# Patient Record
Sex: Male | Born: 1985 | Race: Asian | Hispanic: No | Marital: Married | Smoking: Former smoker
Health system: Southern US, Community
[De-identification: ages and names within clinical notes are randomized; demographics above are authoritative.]

## PROBLEM LIST (undated history)

## (undated) DIAGNOSIS — K648 Other hemorrhoids: Secondary | ICD-10-CM

## (undated) DIAGNOSIS — R519 Headache, unspecified: Secondary | ICD-10-CM

## (undated) DIAGNOSIS — R42 Dizziness and giddiness: Secondary | ICD-10-CM

## (undated) DIAGNOSIS — R51 Headache: Secondary | ICD-10-CM

## (undated) HISTORY — DX: Headache: R51

## (undated) HISTORY — PX: NASAL SINUS SURGERY: SHX719

## (undated) HISTORY — DX: Other hemorrhoids: K64.8

## (undated) HISTORY — DX: Headache, unspecified: R51.9

## (undated) HISTORY — DX: Dizziness and giddiness: R42

---

## 2006-09-26 ENCOUNTER — Ambulatory Visit: Payer: Self-pay | Admitting: Gastroenterology

## 2006-10-31 ENCOUNTER — Ambulatory Visit: Payer: Self-pay | Admitting: Gastroenterology

## 2008-06-17 ENCOUNTER — Emergency Department (HOSPITAL_COMMUNITY): Admission: EM | Admit: 2008-06-17 | Discharge: 2008-06-17 | Payer: Self-pay | Admitting: Family Medicine

## 2008-07-02 ENCOUNTER — Emergency Department (HOSPITAL_COMMUNITY): Admission: EM | Admit: 2008-07-02 | Discharge: 2008-07-02 | Payer: Self-pay | Admitting: Family Medicine

## 2008-11-17 ENCOUNTER — Emergency Department (HOSPITAL_COMMUNITY): Admission: EM | Admit: 2008-11-17 | Discharge: 2008-11-17 | Payer: Self-pay | Admitting: Family Medicine

## 2010-02-09 ENCOUNTER — Emergency Department (HOSPITAL_COMMUNITY): Admission: EM | Admit: 2010-02-09 | Discharge: 2010-02-09 | Payer: Self-pay | Admitting: Family Medicine

## 2010-02-22 ENCOUNTER — Emergency Department (HOSPITAL_COMMUNITY): Admission: EM | Admit: 2010-02-22 | Discharge: 2010-02-22 | Payer: Self-pay | Admitting: Family Medicine

## 2010-04-26 ENCOUNTER — Emergency Department (HOSPITAL_COMMUNITY): Admission: EM | Admit: 2010-04-26 | Discharge: 2010-04-26 | Payer: Self-pay | Admitting: Emergency Medicine

## 2010-10-01 ENCOUNTER — Inpatient Hospital Stay (INDEPENDENT_AMBULATORY_CARE_PROVIDER_SITE_OTHER)
Admission: RE | Admit: 2010-10-01 | Discharge: 2010-10-01 | Disposition: A | Discharge status: Home / Self Care | Payer: Self-pay | Source: Ambulatory Visit | Attending: Emergency Medicine | Admitting: Emergency Medicine

## 2010-10-01 DIAGNOSIS — K140 Glossitis: Secondary | ICD-10-CM

## 2010-10-01 DIAGNOSIS — J309 Allergic rhinitis, unspecified: Secondary | ICD-10-CM

## 2010-10-01 DIAGNOSIS — K219 Gastro-esophageal reflux disease without esophagitis: Secondary | ICD-10-CM

## 2011-05-10 LAB — POCT H PYLORI SCREEN: H. PYLORI SCREEN, POC: NEGATIVE

## 2011-05-23 ENCOUNTER — Inpatient Hospital Stay (INDEPENDENT_AMBULATORY_CARE_PROVIDER_SITE_OTHER)
Admission: RE | Admit: 2011-05-23 | Discharge: 2011-05-23 | Disposition: A | Discharge status: Home / Self Care | Payer: Self-pay | Source: Ambulatory Visit | Attending: Family Medicine | Admitting: Family Medicine

## 2011-05-23 DIAGNOSIS — J309 Allergic rhinitis, unspecified: Secondary | ICD-10-CM

## 2011-08-23 ENCOUNTER — Emergency Department (HOSPITAL_COMMUNITY)
Admission: EM | Admit: 2011-08-23 | Discharge: 2011-08-23 | Disposition: A | Discharge status: Home / Self Care | Payer: Self-pay | Source: Home / Self Care | Attending: Emergency Medicine | Admitting: Emergency Medicine

## 2011-08-23 ENCOUNTER — Encounter (HOSPITAL_COMMUNITY): Payer: Self-pay | Admitting: Cardiology

## 2011-08-23 DIAGNOSIS — J309 Allergic rhinitis, unspecified: Secondary | ICD-10-CM

## 2011-08-23 DIAGNOSIS — J329 Chronic sinusitis, unspecified: Secondary | ICD-10-CM

## 2011-08-23 MED ORDER — CHLORPHENIRAMINE-PSEUDOEPH 12-120 MG PO TB12: 1.0000 | ORAL_TABLET | Freq: Two times a day (BID) | ORAL | Status: DC

## 2011-08-23 MED ORDER — AMOXICILLIN 500 MG PO CAPS: 1000.0000 mg | ORAL_CAPSULE | Freq: Three times a day (TID) | ORAL | Status: AC

## 2011-08-23 MED ORDER — FLUTICASONE PROPIONATE 50 MCG/ACT NA SUSP: 2.0000 | Freq: Every day | NASAL | Status: DC

## 2011-08-23 NOTE — ED Provider Notes (Signed)
Chief Complaint  Patient presents with  . Nasal Congestion  . Headache  . Dizziness  . Fatigue    History of Present Illness:  The patient has been here multiple times before for the same symptoms. There is somewhat of a language barrier. He describes a one to two-year history of chronic nasal congestion on the left, sneezing, rhinorrhea, sinus pressure, headache, and dizziness. Sometimes this interferes with his sleep and he states he does not sleep well, dreams constantly, and wakes up feeling tired. He states she's tried various antibiotics, decongestants, nasal sprays without relief. He has been referred to an ENT doctor but he was never able to followup because of lack of insurance. He comes in today concerned because the drainage has been somewhat bloody. He denies fever, chills, sore throat, or cough.  Review of Systems:  Other than noted above, the patient denies any of the following symptoms. Systemic:  No fever, chills, sweats, fatigue, myalgias, headache, or anorexia. Eye:  No redness, pain or drainage. ENT:  No earache, nasal congestion, rhinorrhea, sinus pressure, or sore throat. Lungs:  No cough, sputum production, wheezing, shortness of breath. Or chest pain. GI:  No nausea, vomiting, abdominal pain or diarrhea. Skin:  No rash or itching.  PMFSH:  Past medical history, family history, social history, meds, and allergies were reviewed.  Physical Exam:   Vital signs:  BP 130/87  Pulse 69  Temp(Src) 98 F (36.7 C) (Oral)  Resp 18  SpO2 98% General:  Alert, in no distress. Eye:  No conjunctival injection or drainage. ENT:  TMs and canals were normal, without erythema or inflammation.  Nasal mucosa was clear and uncongested, without drainage.  Mucous membranes were moist.  Pharynx was clear, without exudate or drainage.  There were no oral ulcerations or lesions. Neck:  Supple, no adenopathy, tenderness or mass. Lungs:  No respiratory distress.  Lungs were clear to  auscultation, without wheezes, rales or rhonchi.  Breath sounds were clear and equal bilaterally. Heart:  Regular rhythm, without gallops, murmers or rubs. Skin:  Clear, warm, and dry, without rash or lesions.  Labs:     Radiology:  No results found.  Medications given in [facility]:  None  Assessment:   Diagnoses that have been ruled out:  None  Diagnoses that are still under consideration:  None  Final diagnoses:  Allergic rhinitis  Sinusitis      Plan:   1.  The following meds were prescribed:   New Prescriptions   AMOXICILLIN (AMOXIL) 500 MG CAPSULE    Take 2 capsules (1,000 mg total) by mouth 3 (three) times daily.   CHLORPHENIRAMINE-PSEUDOEPH 12-120 MG TB12    Take 1 tablet by mouth 2 (two) times daily.   FLUTICASONE (FLONASE) 50 MCG/ACT NASAL SPRAY    Place 2 sprays into the nose daily.   2.  The patient was instructed in symptomatic care and handouts were given. 3.  The patient was told to return if becoming worse in any way, if no better in 3 or 4 days, and given some red flag symptoms that would indicate earlier return. 4.  He really needs to see an ENT doctor. I think he just has allergic rhinitis and chronic sinusitis. Try medications in the meantime, I think is a likelihood that he'll followup with the ENT to be referred him to.   Roque Lias, MD 08/23/11 [phone]

## 2011-08-23 NOTE — ED Notes (Signed)
Pt reports headache and nasal congestion. Pt reports nasal drainage red for the past couple days. No red drainage on exam. Denies fever/chills. Toleration PO intake.  Sleeplessness for the past 2 years.

## 2011-10-28 ENCOUNTER — Emergency Department (HOSPITAL_COMMUNITY)
Admission: EM | Admit: 2011-10-28 | Discharge: 2011-10-28 | Disposition: A | Discharge status: Home / Self Care | Payer: Self-pay | Source: Home / Self Care | Attending: Emergency Medicine | Admitting: Emergency Medicine

## 2011-10-28 ENCOUNTER — Encounter (HOSPITAL_COMMUNITY): Payer: Self-pay | Admitting: *Deleted

## 2011-10-28 DIAGNOSIS — R05 Cough: Secondary | ICD-10-CM

## 2011-10-28 DIAGNOSIS — J309 Allergic rhinitis, unspecified: Secondary | ICD-10-CM

## 2011-10-28 MED ORDER — PREDNISONE 10 MG PO TABS: ORAL_TABLET | ORAL | Status: DC

## 2011-10-28 MED ORDER — AMOXICILLIN 500 MG PO CAPS: 1000.0000 mg | ORAL_CAPSULE | Freq: Three times a day (TID) | ORAL | Status: AC

## 2011-10-28 MED ORDER — METHYLPREDNISOLONE ACETATE PF 80 MG/ML IJ SUSP
80.0000 mg | Freq: Once | INTRAMUSCULAR | Status: AC
  Administered 2011-10-28: 80 mg via INTRAMUSCULAR

## 2011-10-28 MED ORDER — CETIRIZINE-PSEUDOEPHEDRINE ER 5-120 MG PO TB12: 1.0000 | ORAL_TABLET | Freq: Every day | ORAL | Status: AC

## 2011-10-28 MED ORDER — METHYLPREDNISOLONE ACETATE 80 MG/ML IJ SUSP
INTRAMUSCULAR | Status: AC
  Filled 2011-10-28: qty 1

## 2011-10-28 MED ORDER — GUAIFENESIN-CODEINE 100-10 MG/5ML PO SYRP: 10.0000 mL | ORAL_SOLUTION | Freq: Four times a day (QID) | ORAL | Status: AC | PRN

## 2011-10-28 NOTE — Discharge Instructions (Signed)
Allergic Rhinitis  Allergic rhinitis is when the mucous membranes in the nose respond to allergens. Allergens are particles in the air that cause your body to have an allergic reaction. This causes you to release allergic antibodies. Through a chain of events, these eventually cause you to release histamine into the blood stream (hence the use of antihistamines). Although meant to be protective to the body, it is this release that causes your discomfort, such as frequent sneezing, congestion and an itchy runny nose.    CAUSES    The pollen allergens may come from grasses, trees, and weeds. This is seasonal allergic rhinitis, or "hay fever." Other allergens cause year-round allergic rhinitis (perennial allergic rhinitis) such as house dust mite allergen, pet dander and mold spores.    SYMPTOMS     Nasal stuffiness (congestion).    Runny, itchy nose with sneezing and tearing of the eyes.    There is often an itching of the mouth, eyes and ears.   It cannot be cured, but it can be controlled with medications.  DIAGNOSIS    If you are unable to determine the offending allergen, skin or blood testing may find it.  TREATMENT     Avoid the allergen.    Medications and allergy shots (immunotherapy) can help.    Hay fever may often be treated with antihistamines in pill or nasal spray forms. Antihistamines block the effects of histamine. There are over-the-counter medicines that may help with nasal congestion and swelling around the eyes. Check with your caregiver before taking or giving this medicine.   If the treatment above does not work, there are many new medications your caregiver can prescribe. Stronger medications may be used if initial measures are ineffective. Desensitizing injections can be used if medications and avoidance fails. Desensitization is when a patient is given ongoing shots until the body becomes less sensitive to the allergen. Make sure you follow up with your caregiver if problems continue.   SEEK MEDICAL CARE IF:     You develop fever (more than 100.5 F (38.1 C).    You develop a cough that does not stop easily (persistent).    You have shortness of breath.    You start wheezing.    Symptoms interfere with normal daily activities.   Document Released: 04/19/2001 Document Revised: 07/14/2011 Document Reviewed: 10/29/2008  ExitCare Patient Information 2012 [employer].    Cough, Adult   A cough is a reflex that helps clear your throat and airways. It can help heal the body or may be a reaction to an irritated airway. A cough may only last 2 or 3 weeks (acute) or may last more than 8 weeks (chronic).    CAUSES  Acute cough:   Viral or bacterial infections.   Chronic cough:   Infections.    Allergies.    Asthma.    Post-nasal drip.    Smoking.    Heartburn or acid reflux.    Some medicines.    Chronic lung problems (COPD).    Cancer.   SYMPTOMS    Cough.    Fever.    Chest pain.    Increased breathing rate.    High-pitched whistling sound when breathing (wheezing).    Colored mucus that you cough up (sputum).   TREATMENT     A bacterial cough may be treated with antibiotic medicine.    A viral cough must run its course and will not respond to antibiotics.    Your caregiver may   recommend other treatments if you have a chronic cough.   HOME CARE INSTRUCTIONS     Only take over-the-counter or prescription medicines for pain, discomfort, or fever as directed by your caregiver. Use cough suppressants only as directed by your caregiver.    Use a cold steam vaporizer or humidifier in your bedroom or home to help loosen secretions.    Sleep in a semi-upright position if your cough is worse at night.    Rest as needed.    Stop smoking if you smoke.   SEEK IMMEDIATE MEDICAL CARE IF:     You have pus in your sputum.    Your cough starts to worsen.    You cannot control your cough with suppressants and are losing sleep.    You begin coughing up blood.     You have difficulty breathing.    You develop pain which is getting worse or is uncontrolled with medicine.    You have a fever.   MAKE SURE YOU:     Understand these instructions.    Will watch your condition.    Will get help right away if you are not doing well or get worse.   Document Released: 01/21/2011 Document Revised: 07/14/2011 Document Reviewed: 01/21/2011  Exit[employer] Patient Information 2012 [employer].

## 2011-10-28 NOTE — ED Provider Notes (Signed)
Chief Complaint  Patient presents with  . Cough    History of Present Illness:   The patient is a 26 year old male who has been here multiple times in the past for similar symptoms. These usually involve some combination of nasal congestion, rhinorrhea, sneezing, postnasal drip, and cough. He's been tried on multiple meds in the past. Nothing seems to give him permanent relief. It has been suggested that he see an allergist, but he cannot afford to do this. He was prescribed Flonase but doesn't like to use a nasal spray. He would prefer to take pills. His current major complaint is cough going on for about the past 2 weeks. Is productive of some yellow sputum. He also complains of headache, nasal congestion, rhinorrhea, sneezing, postnasal drip, itchy, watery eyes, and scratchy throat. He denies any fever or chills. He hasn't had any wheezing, chest tightness, or shortness of breath. He denies any reflux symptoms.  Review of Systems:  Other than noted above, the patient denies any of the following symptoms. Systemic:  No fever, chills, sweats, fatigue, myalgias, headache, or anorexia. Eye:  No redness, pain or drainage. ENT:  No earache, nasal congestion, rhinorrhea, sinus pressure, or sore throat. Lungs:  No cough, sputum production, wheezing, shortness of breath. Or chest pain. GI:  No nausea, vomiting, abdominal pain or diarrhea. Skin:  No rash or itching.  PMFSH:  Past medical history, family history, social history, meds, and allergies were reviewed.  Physical Exam:   Vital signs:  BP 155/93  Pulse 72  Temp(Src) 97.2 F (36.2 C) (Oral)  Resp 18  SpO2 98% General:  Alert, in no distress. Eye:  No conjunctival injection or drainage. ENT:  TMs and canals were normal, without erythema or inflammation.  Nasal mucosa was clear and uncongested, without drainage.  Mucous membranes were moist.  Pharynx was clear, without exudate or drainage.  There were no oral ulcerations or lesions. Neck:   Supple, no adenopathy, tenderness or mass. Lungs:  No respiratory distress.  Lungs were clear to auscultation, without wheezes, rales or rhonchi.  Breath sounds were clear and equal bilaterally. Heart:  Regular rhythm, without gallops, murmers or rubs. Skin:  Clear, warm, and dry, without rash or lesions.  Assessment:   Diagnoses that have been ruled out:  None  Diagnoses that are still under consideration:  None  Final diagnoses:  Cough - I think his chronic cough symptoms are due to postnasal drainage from allergic rhinitis, sinusitis, or both. I think he needs to see an allergist, but I doubt that is going to follow through with this.   Allergic rhinitis      Plan:   1.  The following meds were prescribed:   New Prescriptions   AMOXICILLIN (AMOXIL) 500 MG CAPSULE    Take 2 capsules (1,000 mg total) by mouth 3 (three) times daily.   CETIRIZINE-PSEUDOEPHEDRINE (ZYRTEC-D) 5-120 MG PER TABLET    Take 1 tablet by mouth daily.   GUAIFENESIN-CODEINE (GUIATUSS AC) 100-10 MG/5ML SYRUP    Take 10 mLs by mouth 4 (four) times daily as needed for cough.   PREDNISONE (DELTASONE) 10 MG TABLET    Take 4 tabs daily for 4 days, 3 tabs daily for 4 days, 2 tabs daily for 4 days, then 1 tab daily for 4 days.   2.  The patient was instructed in symptomatic care and handouts were given. 3.  The patient was told to return if becoming worse in any way, if no better in 3  or 4 days, and given some red flag symptoms that would indicate earlier return.   Reuben Likes, MD 10/28/11 1728

## 2011-10-28 NOTE — ED Notes (Signed)
Pt  Reports  Symptoms  Of  Cough   /  Congested       Head  Sinus  Feels  Stuffy   And  Congested    Symptoms  X  2  Weeks       He  Is  Sitting upright on  Exam  Table    Speaking in  Complete  sentances

## 2011-11-09 ENCOUNTER — Other Ambulatory Visit (HOSPITAL_COMMUNITY): Payer: Self-pay | Admitting: Family Medicine

## 2011-11-11 ENCOUNTER — Ambulatory Visit (HOSPITAL_COMMUNITY)
Admission: RE | Admit: 2011-11-11 | Discharge: 2011-11-11 | Disposition: A | Discharge status: Home / Self Care | Payer: Self-pay | Source: Ambulatory Visit | Attending: Family Medicine | Admitting: Family Medicine

## 2011-11-11 DIAGNOSIS — R51 Headache: Secondary | ICD-10-CM | POA: Insufficient documentation

## 2011-11-11 DIAGNOSIS — J3489 Other specified disorders of nose and nasal sinuses: Secondary | ICD-10-CM | POA: Insufficient documentation

## 2011-12-28 ENCOUNTER — Emergency Department (HOSPITAL_COMMUNITY)
Admission: EM | Admit: 2011-12-28 | Discharge: 2011-12-28 | Disposition: A | Discharge status: Home / Self Care | Payer: Self-pay | Source: Home / Self Care | Attending: Family Medicine | Admitting: Family Medicine

## 2011-12-28 ENCOUNTER — Encounter (HOSPITAL_COMMUNITY): Payer: Self-pay

## 2011-12-28 DIAGNOSIS — J302 Other seasonal allergic rhinitis: Secondary | ICD-10-CM

## 2011-12-28 DIAGNOSIS — J309 Allergic rhinitis, unspecified: Secondary | ICD-10-CM

## 2011-12-28 MED ORDER — FLUTICASONE PROPIONATE 50 MCG/ACT NA SUSP: 1.0000 | Freq: Two times a day (BID) | NASAL | Status: DC

## 2011-12-28 MED ORDER — FEXOFENADINE HCL 180 MG PO TABS: 180.0000 mg | ORAL_TABLET | Freq: Every day | ORAL | Status: DC

## 2011-12-28 MED ORDER — METHYLPREDNISOLONE ACETATE 40 MG/ML IJ SUSP
80.0000 mg | Freq: Once | INTRAMUSCULAR | Status: AC
  Administered 2011-12-28: 80 mg via INTRAMUSCULAR

## 2011-12-28 MED ORDER — METHYLPREDNISOLONE ACETATE 80 MG/ML IJ SUSP
INTRAMUSCULAR | Status: AC
  Filled 2011-12-28: qty 1

## 2011-12-28 MED ORDER — METHYLPREDNISOLONE 4 MG PO KIT: PACK | ORAL | Status: AC

## 2011-12-28 NOTE — ED Provider Notes (Signed)
History     CSN: [id number]  Arrival date & time 12/28/11  1715   First MD Initiated Contact with Patient 12/28/11 1732      Chief Complaint  Patient presents with  . Sinus Problem    (Consider location/radiation/quality/duration/timing/severity/associated sxs/prior treatment) Patient is a 26 y.o. male presenting with sinus complaint. The history is provided by the patient.  Sinus Problem This is a recurrent problem. The current episode started more than 1 week ago (2 weeks ago). The problem has been gradually worsening.    History reviewed. No pertinent past medical history.  History reviewed. No pertinent past surgical history.  No family history on file.  History  Substance Use Topics  . Smoking status: Never Smoker   . Smokeless tobacco: Not on file  . Alcohol Use: No      Review of Systems  Constitutional: Negative.   HENT: Positive for congestion, rhinorrhea and postnasal drip.   Respiratory: Negative for cough and wheezing.   Cardiovascular: Negative.   Skin: Negative.     Allergies  Strawberry  Home Medications   Current Outpatient Rx  Name Route Sig Dispense Refill  . CETIRIZINE-PSEUDOEPHEDRINE ER 5-120 MG PO TB12 Oral Take 1 tablet by mouth daily. 30 tablet 0  . CHLORPHENIRAMINE-PSEUDOEPH 12-120 MG PO TB12 Oral Take 1 tablet by mouth 2 (two) times daily. 20 each 0  . FEXOFENADINE HCL 180 MG PO TABS Oral Take 1 tablet (180 mg total) by mouth daily. 30 tablet 1  . FLUTICASONE PROPIONATE 50 MCG/ACT NA SUSP Nasal Place 2 sprays into the nose daily. 16 g 0  . FLUTICASONE PROPIONATE 50 MCG/ACT NA SUSP Nasal Place 1 spray into the nose 2 (two) times daily. 1 g 2  . METHYLPREDNISOLONE 4 MG PO KIT  follow package directions, start on Thursday, take until finished 21 tablet 0  . PREDNISONE 10 MG PO TABS  Take 4 tabs daily for 4 days, 3 tabs daily for 4 days, 2 tabs daily for 4 days, then 1 tab daily for 4 days. 40 tablet 0    BP 134/90  Pulse 60  Temp(Src)  98.3 F (36.8 C) (Oral)  Resp 20  SpO2 100%  Physical Exam  Nursing note and vitals reviewed. Constitutional: He is oriented to person, place, and time. He appears well-developed and well-nourished.  HENT:  Head: Normocephalic.  Right Ear: External ear normal.  Left Ear: External ear normal.  Nose: Mucosal edema and rhinorrhea present.  Mouth/Throat: Oropharynx is clear and moist.  Eyes: Pupils are equal, round, and reactive to light.  Neck: Normal range of motion. Neck supple.  Cardiovascular: Regular rhythm and normal heart sounds.   Pulmonary/Chest: Breath sounds normal.  Lymphadenopathy:    He has no cervical adenopathy.  Neurological: He is alert and oriented to person, place, and time.  Skin: Skin is warm and dry.    ED Course  Procedures (including critical care time)  Labs Reviewed - No data to display No results found.   1. Seasonal allergic rhinitis       MDM          Linna Hoff, MD 12/28/11 [phone]

## 2011-12-28 NOTE — ED Notes (Signed)
C/o nasal congestion, sinus congestion for 2 weeks.  Has been experiencing this intermittently since December.  Had CT of sinuses in April 2013.

## 2012-03-20 ENCOUNTER — Emergency Department (HOSPITAL_COMMUNITY): Payer: Self-pay

## 2012-03-20 ENCOUNTER — Emergency Department (INDEPENDENT_AMBULATORY_CARE_PROVIDER_SITE_OTHER): Payer: Self-pay

## 2012-03-20 ENCOUNTER — Encounter (HOSPITAL_COMMUNITY): Payer: Self-pay | Admitting: Emergency Medicine

## 2012-03-20 ENCOUNTER — Emergency Department (INDEPENDENT_AMBULATORY_CARE_PROVIDER_SITE_OTHER)
Admission: EM | Admit: 2012-03-20 | Discharge: 2012-03-20 | Disposition: A | Discharge status: Home / Self Care | Payer: Self-pay | Source: Home / Self Care | Attending: Emergency Medicine | Admitting: Emergency Medicine

## 2012-03-20 DIAGNOSIS — K589 Irritable bowel syndrome without diarrhea: Secondary | ICD-10-CM

## 2012-03-20 LAB — CBC WITH DIFFERENTIAL/PLATELET
Basophils Absolute: 0 10*3/uL (ref 0.0–0.1)
Basophils Relative: 0 % (ref 0–1)
Eosinophils Absolute: 0.8 10*3/uL — ABNORMAL HIGH (ref 0.0–0.7)
Eosinophils Relative: 9 % — ABNORMAL HIGH (ref 0–5)
Lymphs Abs: 2.8 10*3/uL (ref 0.7–4.0)
MCHC: 34.3 g/dL (ref 30.0–36.0)
Monocytes Absolute: 0.6 10*3/uL (ref 0.1–1.0)
Monocytes Relative: 6 % (ref 3–12)
RBC: 5.39 MIL/uL (ref 4.22–5.81)

## 2012-03-20 MED ORDER — DICYCLOMINE HCL 20 MG PO TABS: ORAL_TABLET | ORAL | Status: DC

## 2012-03-20 MED ORDER — OMEPRAZOLE 20 MG PO CPDR: 20.0000 mg | DELAYED_RELEASE_CAPSULE | Freq: Every day | ORAL | Status: DC

## 2012-03-20 NOTE — ED Notes (Signed)
Reports chronic issues with abdominal pain, diarrhea, no vomiting.

## 2012-03-20 NOTE — ED Provider Notes (Signed)
Chief Complaint  Patient presents with  . Abdominal Pain    History of Present Illness:    The patient is a 26 year-old [place] male who has had a one-year history of recurring abdominal pain. This usually begins in the left lower quadrant and radiates to the right lower quadrant. It tends to come on after eating certain foods, particularly dairy products. It can occur with any kind of foods however. After he eats he'll often have a watery stool and this will relieve the pain. He denies any blood in the stool. He denies fever or chills. His appetite has been good and has been no weight loss. She's had no nausea or vomiting he does have occasional waterbrash. No urinary symptoms. He's never been evaluated for this before.  Review of Systems:  Other than noted above, the patient denies any of the following symptoms: Constitutional:  No fever, chills, fatigue, weight loss or anorexia. Lungs:  No cough or shortness of breath. Heart:  No chest pain, palpitations, syncope or edema.  No cardiac history. Abdomen:  No nausea, vomiting, hematememesis, melena, diarrhea, or hematochezia. GU:  No dysuria, frequency, urgency, or hematuria.  No testicular pain or swelling. Skin:  No rash or itching.  PMFSH:  Past medical history, family history, social history, meds, and allergies were reviewed along with nurse's notes.  No prior abdominal surgeries or history of GI problems.  No use of NSAIDs or aspirin.  No excessive  alcohol intake.  Physical Exam:   Vital signs:  BP 132/90  Pulse 58  Temp 98.9 F (37.2 C) (Oral)  Resp 18  SpO2 100% Gen:  Alert, oriented, in no distress. Lungs:  Breath sounds clear and equal bilaterally.  No wheezes, rales or rhonchi. Heart:  Regular rhythm.  No gallops or murmers.   Abdomen:  Abdomen is soft, flat, nondistended. There is no tenderness to palpation, guarding, or rebound. No organomegaly or mass. Bowel sounds are normally active. Rectal exam:  No masses, no tenderness,  normal prostate, no nodules, and heme-negative stool. Skin:  Clear, warm and dry.  No rash.    Labs:   Results for orders placed during the hospital encounter of 03/20/12  CBC WITH DIFFERENTIAL      Component Value Range   WBC 9.9  4.0 - 10.5 K/uL   RBC 5.39  4.22 - 5.81 MIL/uL   Hemoglobin 15.4  13.0 - 17.0 g/dL   HCT 14.7  82.9 - 56.2 %   MCV 83.3  78.0 - 100.0 fL   MCH 28.6  26.0 - 34.0 pg   MCHC 34.3  30.0 - 36.0 g/dL   RDW 13.0  86.5 - 78.4 %   Platelets 254  150 - 400 K/uL   Neutrophils Relative 57  43 - 77 %   Neutro Abs 5.6  1.7 - 7.7 K/uL   Lymphocytes Relative 28  12 - 46 %   Lymphs Abs 2.8  0.7 - 4.0 K/uL   Monocytes Relative 6  3 - 12 %   Monocytes Absolute 0.6  0.1 - 1.0 K/uL   Eosinophils Relative 9 (*) 0 - 5 %   Eosinophils Absolute 0.8 (*) 0.0 - 0.7 K/uL   Basophils Relative 0  0 - 1 %   Basophils Absolute 0.0  0.0 - 0.1 K/uL  OCCULT BLOOD, POC DEVICE      Component Value Range   Fecal Occult Bld NEGATIVE       Radiology:  Dg Abd Acute W/chest  03/20/2012  *RADIOLOGY REPORT*  Clinical Data: Abdominal pain  ACUTE ABDOMEN SERIES (ABDOMEN 2 VIEW & CHEST 1 VIEW)  Comparison: None.  Findings: Chest:  Lungs are clear without infiltrate or effusion. Negative for heart failure.  Abdomen:  Normal bowel gas pattern.  Negative for bowel obstruction.  Negative for pneumoperitoneum.  Expected stool in the right colon.  No bony abnormality and no renal calculi.  IMPRESSION: No acute abnormality in the chest or abdomen.  Original Report Authenticated By: Camelia Phenes, M.D.    Assessment:  The encounter diagnosis was Irritable bowel syndrome.  Plan:   1.  The following meds were prescribed:   New Prescriptions   DICYCLOMINE (BENTYL) 20 MG TABLET    Take 1 every 8 hours as needed for abdominal pain.   OMEPRAZOLE (PRILOSEC) 20 MG CAPSULE    Take 1 capsule (20 mg total) by mouth daily.   2.  The patient was instructed in symptomatic care and handouts were given. 3.  The  patient was told to return if becoming worse in any way, if no better in 3 or 4 days, and given some red flag symptoms that would indicate earlier return. I suggested that he try avoiding milk and dairy products, keeping a food diary, and trying probiotics. He was told to followup with Dr. Vilinda Boehringer in 2 weeks.    Reuben Likes, MD 03/20/12 2121

## 2014-08-28 ENCOUNTER — Ambulatory Visit (INDEPENDENT_AMBULATORY_CARE_PROVIDER_SITE_OTHER): Payer: No Typology Code available for payment source | Admitting: Internal Medicine

## 2014-08-28 ENCOUNTER — Encounter: Payer: Self-pay | Admitting: Internal Medicine

## 2014-08-28 VITALS — BP 144/93 | HR 74 | Temp 98.0°F | Resp 16 | Ht 64.0 in | Wt 166.0 lb

## 2014-08-28 DIAGNOSIS — R112 Nausea with vomiting, unspecified: Secondary | ICD-10-CM

## 2014-08-28 DIAGNOSIS — R1013 Epigastric pain: Secondary | ICD-10-CM

## 2014-08-29 LAB — CBC WITH DIFFERENTIAL/PLATELET
Basophils Absolute: 0 10*3/uL (ref 0.0–0.1)
Basophils Relative: 0 % (ref 0–1)
Eosinophils Absolute: 0.7 10*3/uL (ref 0.0–0.7)
Eosinophils Relative: 7 % — ABNORMAL HIGH (ref 0–5)
HEMATOCRIT: 45.2 % (ref 39.0–52.0)
Hemoglobin: 14.9 g/dL (ref 13.0–17.0)
LYMPHS ABS: 2.4 10*3/uL (ref 0.7–4.0)
LYMPHS PCT: 25 % (ref 12–46)
MCH: 27.7 pg (ref 26.0–34.0)
MCHC: 33 g/dL (ref 30.0–36.0)
MCV: 84 fL (ref 78.0–100.0)
MONOS PCT: 6 % (ref 3–12)
MPV: 9.4 fL (ref 8.6–12.4)
Monocytes Absolute: 0.6 10*3/uL (ref 0.1–1.0)
NEUTROS PCT: 62 % (ref 43–77)
Neutro Abs: 6 10*3/uL (ref 1.7–7.7)
Platelets: 275 10*3/uL (ref 150–400)
RBC: 5.38 MIL/uL (ref 4.22–5.81)
RDW: 14.7 % (ref 11.5–15.5)
WBC: 9.6 10*3/uL (ref 4.0–10.5)

## 2014-08-29 LAB — URINALYSIS
BILIRUBIN URINE: NEGATIVE
GLUCOSE, UA: NEGATIVE mg/dL
HGB URINE DIPSTICK: NEGATIVE
KETONES UR: NEGATIVE mg/dL
LEUKOCYTES UA: NEGATIVE
Nitrite: NEGATIVE
PH: 6.5 (ref 5.0–8.0)
Protein, ur: NEGATIVE mg/dL
Specific Gravity, Urine: 1.009 (ref 1.005–1.030)
Urobilinogen, UA: 0.2 mg/dL (ref 0.0–1.0)

## 2014-08-29 LAB — COMPREHENSIVE METABOLIC PANEL
ALT: 113 U/L — ABNORMAL HIGH (ref 0–53)
AST: 40 U/L — AB (ref 0–37)
Albumin: 4.3 g/dL (ref 3.5–5.2)
Alkaline Phosphatase: 83 U/L (ref 39–117)
BUN: 12 mg/dL (ref 6–23)
CHLORIDE: 101 meq/L (ref 96–112)
CO2: 28 mEq/L (ref 19–32)
Calcium: 9.7 mg/dL (ref 8.4–10.5)
Creat: 1.01 mg/dL (ref 0.50–1.35)
Glucose, Bld: 82 mg/dL (ref 70–99)
Potassium: 3.7 mEq/L (ref 3.5–5.3)
SODIUM: 139 meq/L (ref 135–145)
TOTAL PROTEIN: 7.9 g/dL (ref 6.0–8.3)
Total Bilirubin: 0.5 mg/dL (ref 0.2–1.2)

## 2014-08-29 LAB — LIPASE: LIPASE: 10 U/L (ref 0–75)

## 2014-09-08 ENCOUNTER — Emergency Department (HOSPITAL_COMMUNITY)
Admission: EM | Admit: 2014-09-08 | Discharge: 2014-09-08 | Disposition: A | Discharge status: Home / Self Care | Payer: Medicaid Other | Attending: Emergency Medicine | Admitting: Emergency Medicine

## 2014-09-08 ENCOUNTER — Encounter (HOSPITAL_COMMUNITY): Payer: Self-pay | Admitting: Emergency Medicine

## 2014-09-08 ENCOUNTER — Emergency Department (HOSPITAL_COMMUNITY): Payer: Medicaid Other

## 2014-09-08 ENCOUNTER — Encounter (HOSPITAL_COMMUNITY): Payer: Self-pay | Admitting: *Deleted

## 2014-09-08 ENCOUNTER — Emergency Department (HOSPITAL_COMMUNITY)
Admission: EM | Admit: 2014-09-08 | Discharge: 2014-09-08 | Disposition: A | Discharge status: Nursing Home (Includes ICF) | Payer: Medicaid Other | Source: Home / Self Care | Attending: Emergency Medicine | Admitting: Emergency Medicine

## 2014-09-08 ENCOUNTER — Other Ambulatory Visit: Payer: Self-pay

## 2014-09-08 DIAGNOSIS — Z79899 Other long term (current) drug therapy: Secondary | ICD-10-CM | POA: Diagnosis not present

## 2014-09-08 DIAGNOSIS — R6883 Chills (without fever): Secondary | ICD-10-CM | POA: Diagnosis not present

## 2014-09-08 DIAGNOSIS — K2970 Gastritis, unspecified, without bleeding: Secondary | ICD-10-CM | POA: Diagnosis not present

## 2014-09-08 DIAGNOSIS — Z7951 Long term (current) use of inhaled steroids: Secondary | ICD-10-CM | POA: Diagnosis not present

## 2014-09-08 DIAGNOSIS — R1013 Epigastric pain: Secondary | ICD-10-CM | POA: Diagnosis present

## 2014-09-08 DIAGNOSIS — Z87891 Personal history of nicotine dependence: Secondary | ICD-10-CM | POA: Diagnosis not present

## 2014-09-08 LAB — COMPREHENSIVE METABOLIC PANEL
ALT: 72 U/L — ABNORMAL HIGH (ref 0–53)
ANION GAP: 9 (ref 5–15)
AST: 39 U/L — AB (ref 0–37)
Albumin: 4.2 g/dL (ref 3.5–5.2)
Alkaline Phosphatase: 77 U/L (ref 39–117)
BILIRUBIN TOTAL: 1.1 mg/dL (ref 0.3–1.2)
BUN: 15 mg/dL (ref 6–23)
CHLORIDE: 103 mmol/L (ref 96–112)
CO2: 27 mmol/L (ref 19–32)
Calcium: 9.3 mg/dL (ref 8.4–10.5)
Creatinine, Ser: 0.96 mg/dL (ref 0.50–1.35)
GFR calc Af Amer: >90 mL/min (ref >90)
GLUCOSE: 98 mg/dL (ref 70–99)
Potassium: 3.7 mmol/L (ref 3.5–5.1)
Sodium: 139 mmol/L (ref 135–145)
TOTAL PROTEIN: 8 g/dL (ref 6.0–8.3)

## 2014-09-08 LAB — CBC WITH DIFFERENTIAL/PLATELET
BASOS PCT: 0 % (ref 0–1)
Basophils Absolute: 0 10*3/uL (ref 0.0–0.1)
Eosinophils Absolute: 0.6 10*3/uL (ref 0.0–0.7)
Eosinophils Relative: 8 % — ABNORMAL HIGH (ref 0–5)
HEMATOCRIT: 45.5 % (ref 39.0–52.0)
HEMOGLOBIN: 15.6 g/dL (ref 13.0–17.0)
LYMPHS ABS: 2.4 10*3/uL (ref 0.7–4.0)
Lymphocytes Relative: 31 % (ref 12–46)
MCH: 27.9 pg (ref 26.0–34.0)
MCHC: 34.3 g/dL (ref 30.0–36.0)
MCV: 81.3 fL (ref 78.0–100.0)
Monocytes Absolute: 0.3 10*3/uL (ref 0.1–1.0)
Monocytes Relative: 4 % (ref 3–12)
NEUTROS ABS: 4.5 10*3/uL (ref 1.7–7.7)
Neutrophils Relative %: 57 % (ref 43–77)
Platelets: 241 10*3/uL (ref 150–400)
RBC: 5.6 MIL/uL (ref 4.22–5.81)
RDW: 13.4 % (ref 11.5–15.5)
WBC: 7.8 10*3/uL (ref 4.0–10.5)

## 2014-09-08 LAB — URINALYSIS, ROUTINE W REFLEX MICROSCOPIC
Bilirubin Urine: NEGATIVE
GLUCOSE, UA: NEGATIVE mg/dL
Hgb urine dipstick: NEGATIVE
Ketones, ur: NEGATIVE mg/dL
Leukocytes, UA: NEGATIVE
NITRITE: NEGATIVE
Protein, ur: NEGATIVE mg/dL
Specific Gravity, Urine: 1.016 (ref 1.005–1.030)
UROBILINOGEN UA: 0.2 mg/dL (ref 0.0–1.0)
pH: 6 (ref 5.0–8.0)

## 2014-09-08 LAB — LIPASE, BLOOD: Lipase: 24 U/L (ref 11–59)

## 2014-09-08 MED ORDER — ONDANSETRON 4 MG PO TBDP
8.0000 mg | ORAL_TABLET | Freq: Once | ORAL | Status: AC
Start: 2014-09-08 — End: 2014-09-08
  Administered 2014-09-08: 8 mg via ORAL

## 2014-09-08 MED ORDER — ONDANSETRON 4 MG PO TBDP
ORAL_TABLET | ORAL | Status: AC
  Filled 2014-09-08: qty 2

## 2014-09-08 MED ORDER — ONDANSETRON HCL 4 MG PO TABS: 4.0000 mg | ORAL_TABLET | Freq: Four times a day (QID) | ORAL | Status: DC

## 2014-09-08 MED ORDER — GI COCKTAIL ~~LOC~~
ORAL | Status: AC
  Filled 2014-09-08: qty 30

## 2014-09-08 MED ORDER — OMEPRAZOLE 20 MG PO CPDR: 20.0000 mg | DELAYED_RELEASE_CAPSULE | Freq: Every day | ORAL | Status: DC

## 2014-09-08 MED ORDER — GI COCKTAIL ~~LOC~~
30.0000 mL | Freq: Once | ORAL | Status: AC
  Administered 2014-09-08: 30 mL via ORAL

## 2014-09-08 NOTE — Discharge Instructions (Signed)
Gastritis, Adult °Gastritis is soreness and puffiness (inflammation) of the lining of the stomach. If you do not get help, gastritis can cause bleeding and sores (ulcers) in the stomach. °HOME CARE  °· Only take medicine as told by your doctor. °· If you were given antibiotic medicines, take them as told. Finish the medicines even if you start to feel better. °· Drink enough fluids to keep your pee (urine) clear or pale yellow. °· Avoid foods and drinks that make your problems worse. Foods you may want to avoid include: °¨ Caffeine or alcohol. °¨ Chocolate. °¨ Mint. °¨ Garlic and onions. °¨ Spicy foods. °¨ Citrus fruits, including oranges, lemons, or limes. °¨ Food containing tomatoes, including sauce, chili, salsa, and pizza. °¨ Fried and fatty foods. °· Eat small meals throughout the day instead of large meals. °GET HELP RIGHT AWAY IF:  °· You have black or dark red poop (stools). °· You throw up (vomit) blood. It may look like coffee grounds. °· You cannot keep fluids down. °· Your belly (abdominal) pain gets worse. °· You have a fever. °· You do not feel better after 1 week. °· You have any other questions or concerns. °MAKE SURE YOU:  °· Understand these instructions. °· Will watch your condition. °· Will get help right away if you are not doing well or get worse. °Document Released: 01/11/2008 Document Revised: 10/17/2011 Document Reviewed: 09/07/2011 °[employer]® Patient Information ©2015 [employer]. This information is not intended to replace advice given to you by your health care provider. Make sure you discuss any questions you have with your health care provider. ° °

## 2014-09-08 NOTE — ED Notes (Signed)
Pt from home for eval of chronic epigastric abd pain, pt states he's been seen for [place] in [place] and work up was inconclusive. States n/v after eating denies any fever or diarrhea or urinary symptoms. LBM 09/07/14. NAD noted.

## 2014-09-08 NOTE — Discharge Instructions (Signed)
We have determined that your problem requires further evaluation in the emergency department.  We will take care of your transport there.  Once at the emergency department, you will be evaluated by a provider and they will order whatever treatment or tests they deem necessary.  We cannot guarantee that they will do any specific test or do any specific treatment.  ° °

## 2014-09-08 NOTE — ED Provider Notes (Signed)
Chief Complaint   Abdominal Pain   History of Present Illness   Derrick Hawkins is a 29 year-old [place] male who's had a 10 year history of recurring epigastric pain. Seems to be getting worse over the past several weeks. Right now is rated as 7-8/10 in intensity. Does not radiate through to his back. Associated with nausea and vomiting of all by mouth intake. No hematemesis, coffee-ground emesis, or bilious emesis. Pain is worse with exercise also with eating and with taking a deep breath. He denies any chest pain but has felt somewhat short of breath. Denies any blood in stool or melena. No fever or chills. No history of ulcer disease or gallbladder problems. Was seen here 6 months ago for lower abdominal pain. Workup at that time was negative. It was thought he had irritable bowel syndrome. He was urged to follow-up with GI or primary care, but never did go. Does not have a primary care physician.  Review of Systems   Other than as noted above, the patient denies any of the following symptoms: Constitutional:  No fever, chills, weight loss or anorexia. Abdomen:  No nausea, vomiting, hematememesis, melena, diarrhea, or hematochezia. GU:  No dysuria, frequency, urgency, or hematuria.  No testicular pain or swelling.  PMFSH   Past medical history, family history, social history, meds, and allergies were reviewed.   Physical Examination     Vital signs:  BP 140/88 mmHg  Pulse 72  Temp(Src) 98.6 F (37 C) (Oral)  Resp 16  SpO2 100% Gen:  Alert, oriented, in no distress. Lungs:  Breath sounds clear and equal bilaterally.  No wheezes, rales or rhonchi. Heart:  Regular rhythm.  No gallops or murmers.   Abdomen:  He has epigastric tenderness to palpation without guarding or rebound. No organomegaly or mass. Bowel sounds are normally active. Murphy sign and Murphy's punch were negative. Skin:  Clear, warm and dry.  No rash.  EKG Results:  Date: 09/08/2014  Rate: 62  Rhythm: normal sinus  rhythm  QRS Axis: normal--71  Intervals: normal  ST/T Wave abnormalities: normal  Conduction Disutrbances:none  Narrative Interpretation: Normal sinus rhythm, benign early repolarization, otherwise normal  Old EKG Reviewed: unchanged   Course in Urgent Care Center   The following medications were given:  Medications  gi cocktail (Maalox,Lidocaine,Donnatal) (30 mLs Oral Given 09/08/14 1051)  ondansetron (ZOFRAN-ODT) disintegrating tablet 8 mg (8 mg Oral Given 09/08/14 1051)   Assessment   The encounter diagnosis was Epigastric pain.  Differential diagnosis is ulcer, gastritis, gastric outlet obstruction, or gallbladder disease. Since he has no primary care physician or follow-up, I think he needs to have further workup done in the emergency department, possibly to include an upper abdominal ultrasound.  Plan     The patient was transferred to the ED via shuttle in stable condition.  Medical Decision Making: 29 year old [place] male has a 10 year history of epigastric pain, nausea, and vomiting.  No hematemesis or melena.  He was seen here 6 months ago for lower abdominal pain.  Workup was negative.  He was told to follow up with GI but did not go.  Does not have a PCP.  On exam he is tender in epigastrium without guarding or rebound.  Bowel sounds are normal.  EKG showed benign early repolarization.  I think he has gastritis, ulcer, or gallbladder disease.  I am concerned about his ability to follow up with specialist or primary care.  I think he needs  [place] and labwork.         Reuben Likes, MD 09/08/14 [phone]

## 2014-09-08 NOTE — ED Notes (Addendum)
Pt reports abdominal pain for the last 9 years. States worsening pain for the last year. Points to epigastric area, c/o burning sensation and vomiting approx min after eating. Sent over for further eval from urgent care. Pt report GI cocktail helped with the pain

## 2014-09-08 NOTE — ED Notes (Signed)
Pt  Reports  Epigastric  Pain           That  He  Has  Had  For  A  Long time   He  Reports  As  Well  intermittant  Vomiting         He has  Tenderness on palpation in the  Epigastric  Area        He  Reports  The  Pain is  Worse  Today    He  Ambulated  To the  Exam room       With a  Steady  Fluid git

## 2014-09-08 NOTE — ED Provider Notes (Addendum)
CSN: [id number]     Arrival date & time 09/08/14  1125 History   First MD Initiated Contact with Patient 09/08/14 1158     Chief Complaint  Patient presents with  . Abdominal Pain     (Consider location/radiation/quality/duration/timing/severity/associated sxs/prior Treatment) Patient is a 29 y.o. male presenting with abdominal pain. The history is provided by the patient.  Abdominal Pain Pain location:  Epigastric Pain quality: aching, burning, cramping and gnawing   Pain radiates to:  Does not radiate Pain severity:  Severe Onset quality:  Gradual Duration: Patient has had intermittent pain for 9 years but worse over the last year and now worse over the last several weeks. Timing:  Constant Progression:  Worsening Chronicity:  Chronic Context: eating   Context: not alcohol use   Context comment:  The pain is always worse after he eats which usually causes him to vomit Relieved by:  Nothing Worsened by:  Eating Ineffective treatments:  None tried Associated symptoms: anorexia, chills, nausea and vomiting   Associated symptoms: no chest pain, no constipation, no cough and no diarrhea   Risk factors: no alcohol abuse     History reviewed. No pertinent past medical history. Past Surgical History  Procedure Laterality Date  . Nasal sinus surgery Left y-3   Family History  Problem Relation Age of Onset  . Hypertension Father   . Asthma Son    History  Substance Use Topics  . Smoking status: Former Smoker    Types: Cigarettes    Quit date: 08/07/2006  . Smokeless tobacco: Not on file  . Alcohol Use: No     Comment: Former heavy ETOH intake. Quit 7 years ago    Review of Systems  Constitutional: Positive for chills.  Respiratory: Negative for cough.   Cardiovascular: Negative for chest pain.  Gastrointestinal: Positive for nausea, vomiting, abdominal pain and anorexia. Negative for diarrhea and constipation.  All other systems reviewed and are  negative.     Allergies  Strawberry  Home Medications   Prior to Admission medications   Medication Sig Start Date End Date Taking? Authorizing Provider  Chlorpheniramine-Pseudoeph 12-120 MG TB12 Take 1 tablet by mouth 2 (two) times daily. Patient not taking: Reported on 08/28/2014 08/23/11   Reuben Likesavid C Keller, MD  dicyclomine (BENTYL) 20 MG tablet Take 1 every 8 hours as needed for abdominal pain. Patient not taking: Reported on 08/28/2014 03/20/12   Reuben Likesavid C Keller, MD  fexofenadine (ALLEGRA) 180 MG tablet Take 1 tablet (180 mg total) by mouth daily. 12/28/11 12/27/12  Linna HoffJames D Kindl, MD  fluticasone (FLONASE) 50 MCG/ACT nasal spray Place 2 sprays into the nose daily. 08/23/11 08/22/12  Reuben Likesavid C Keller, MD  fluticasone (FLONASE) 50 MCG/ACT nasal spray Place 1 spray into the nose 2 (two) times daily. 12/28/11 12/27/12  Linna HoffJames D Kindl, MD  omeprazole (PRILOSEC) 20 MG capsule Take 1 capsule (20 mg total) by mouth daily. 03/20/12 03/20/13  Reuben Likesavid C Keller, MD  predniSONE (DELTASONE) 10 MG tablet Take 4 tabs daily for 4 days, 3 tabs daily for 4 days, 2 tabs daily for 4 days, then 1 tab daily for 4 days. Patient not taking: Reported on 08/28/2014 10/28/11   Reuben Likesavid C Keller, MD   BP 136/98 mmHg  Pulse 62  Temp(Src) 98 F (36.7 C) (Oral)  Resp 14  Ht 5\' 2"  (1.575 m)  Wt 165 lb (74.844 kg)  BMI 30.17 kg/m2  SpO2 100% Physical Exam  Constitutional: He is oriented to person, place, and  time. He appears well-developed and well-nourished. No distress.  HENT:  Head: Normocephalic and atraumatic.  Mouth/Throat: Oropharynx is clear and moist.  Eyes: Conjunctivae and EOM are normal. Pupils are equal, round, and reactive to light.  Neck: Normal range of motion. Neck supple.  Cardiovascular: Normal rate, regular rhythm and intact distal pulses.   No murmur heard. Pulmonary/Chest: Effort normal and breath sounds normal. No respiratory distress. He has no wheezes. He has no rales.  Abdominal: Soft. Normal  appearance. He exhibits no distension. There is tenderness in the epigastric area. There is no rebound, no guarding and negative Murphy's sign.  Musculoskeletal: Normal range of motion. He exhibits no edema or tenderness.  Neurological: He is alert and oriented to person, place, and time.  Skin: Skin is warm and dry. No rash noted. No erythema.  Psychiatric: He has a normal mood and affect. His behavior is normal.  Nursing note and vitals reviewed.   ED Course  Procedures (including critical care time) Labs Review Labs Reviewed  COMPREHENSIVE METABOLIC PANEL - Abnormal; Notable for the following:    AST 39 (*)    ALT 72 (*)    All other components within normal limits  CBC WITH DIFFERENTIAL/PLATELET - Abnormal; Notable for the following:    Eosinophils Relative 8 (*)    All other components within normal limits  LIPASE, BLOOD  URINALYSIS, ROUTINE W REFLEX MICROSCOPIC    Imaging Review US Abdomen Limited  09/08/2014   CLINICAL DATA:  Right upper quadrant abdominal pain.  EXAM: US ABDOMEN LIMITED - RIGHT UPPER QUADRANT  COMPARISON:  Plain films 03/20/2012  FINDINGS: Gallbladder:  No gallstones or wall thickening visualized. No sonographic Murphy sign noted.  Common bile duct:  Diameter: Normal caliber, 3 mm  Liver:  No focal lesion identified. Within normal limits in parenchymal echogenicity.  IMPRESSION: Unremarkable right upper quadrant ultrasound.   Electronically Signed   By: Charlett Nose M.D.   On: 09/08/2014 15:05     EKG Interpretation None      MDM   Final diagnoses:  Gastritis    Patient with persistently worsening abdominal pain that he points to in the epigastric region that's causing vomiting. He was seen in urgent care and sent here for further evaluation. Patient has been seen there in the past and symptoms are most likely gastritis versus gastric ulcer however cannot rule out cholecystitis. Patient states now every time he needs to get severe pain which causes him  to vomit. After GI cocktail his pain is improved. He's been having intermittent abdominal pain for approximately 9 years even when he still lifted the innominate however back then it was related to drinking alcohol and he no longer drinks alcohol.  Labs are significant for mild elevated LFTs ALT greater than AST which are slightly improved from earlier this month. Lipase is within normal limits and CBC is normal. Right upper quadrant ultrasound pending to rule out gallstones as a cause for his pain.  3:33 PM Imaging neg. Pt has had no vomiting here and tolerating po's.  Will d/c home with ppi and zofran.  Gwyneth Sprout, MD 09/08/14 1535  Gwyneth Sprout, MD 09/08/14 1537

## 2014-09-10 ENCOUNTER — Encounter (HOSPITAL_COMMUNITY): Payer: Self-pay | Admitting: Emergency Medicine

## 2014-09-10 ENCOUNTER — Emergency Department (HOSPITAL_COMMUNITY)
Admission: EM | Admit: 2014-09-10 | Discharge: 2014-09-10 | Disposition: A | Discharge status: Home / Self Care | Payer: Medicaid Other | Source: Home / Self Care | Attending: Emergency Medicine | Admitting: Emergency Medicine

## 2014-09-10 DIAGNOSIS — M25511 Pain in right shoulder: Secondary | ICD-10-CM

## 2014-09-10 DIAGNOSIS — G8929 Other chronic pain: Secondary | ICD-10-CM

## 2014-09-10 MED ORDER — DICLOFENAC SODIUM 50 MG PO TBEC: 50.0000 mg | DELAYED_RELEASE_TABLET | Freq: Two times a day (BID) | ORAL | Status: DC

## 2014-09-10 NOTE — Discharge Instructions (Signed)
Please take medication as prescribed and apply Aspercream as directed on packaging for pain. You need to establish care with the primary care provider of your choice to follow up with if this does not improve and for any additional routine medical needs.  Shoulder Pain The shoulder is the joint that connects your arms to your body. The bones that form the shoulder joint include the upper arm bone (humerus), the shoulder blade (scapula), and the collarbone (clavicle). The top of the humerus is shaped like a ball and fits into a rather flat socket on the scapula (glenoid cavity). A combination of muscles and strong, fibrous tissues that connect muscles to bones (tendons) support your shoulder joint and hold the ball in the socket. Small, fluid-filled sacs (bursae) are located in different areas of the joint. They act as cushions between the bones and the overlying soft tissues and help reduce friction between the gliding tendons and the bone as you move your arm. Your shoulder joint allows a wide range of motion in your arm. This range of motion allows you to do things like scratch your back or throw a ball. However, this range of motion also makes your shoulder more prone to pain from overuse and injury. Causes of shoulder pain can originate from both injury and overuse and usually can be grouped in the following four categories:  Redness, swelling, and pain (inflammation) of the tendon (tendinitis) or the bursae (bursitis).  Instability, such as a dislocation of the joint.  Inflammation of the joint (arthritis).  Broken bone (fracture). HOME CARE INSTRUCTIONS   Apply ice to the sore area.  Put ice in a plastic bag.  Place a towel between your skin and the bag.  Leave the ice on for 15-20 minutes, 3-4 times per day for the first 2 days, or as directed by your health care provider.  Stop using cold packs if they do not help with the pain.  If you have a shoulder sling or immobilizer, wear it as  long as your caregiver instructs. Only remove it to shower or bathe. Move your arm as little as possible, but keep your hand moving to prevent swelling.  Squeeze a soft ball or foam pad as much as possible to help prevent swelling.  Only take over-the-counter or prescription medicines for pain, discomfort, or fever as directed by your caregiver. SEEK MEDICAL CARE IF:   Your shoulder pain increases, or new pain develops in your arm, hand, or fingers.  Your hand or fingers become cold and numb.  Your pain is not relieved with medicines. SEEK IMMEDIATE MEDICAL CARE IF:   Your arm, hand, or fingers are numb or tingling.  Your arm, hand, or fingers are significantly swollen or turn white or blue. MAKE SURE YOU:   Understand these instructions.  Will watch your condition.  Will get help right away if you are not doing well or get worse. Document Released: 05/04/2005 Document Revised: 12/09/2013 Document Reviewed: 07/09/2011 [facility] Patient Information 2015 [place], [place]. This information is not intended to replace advice given to you by your health care provider. Make sure you discuss any questions you have with your health care provider.

## 2014-09-10 NOTE — ED Provider Notes (Signed)
CSN: [id number]     Arrival date & time 09/10/14  0803 History   First MD Initiated Contact with Patient 09/10/14 [phone]     Chief Complaint  Patient presents with  . Shoulder Pain   (Consider location/radiation/quality/duration/timing/severity/associated sxs/prior Treatment) HPI Comments: Unemployed No reported injury Reports this to be chronic issue since 2014 Uses occasional tylenol or ibuprofen at home for pain and occasional OTC "pain cream."   Patient is a 29 y.o. male presenting with shoulder pain. The history is provided by the patient.  Shoulder Pain Location:  Shoulder Time since incident: chronic, intermittent since 2014. Injury: no   Shoulder location:  R shoulder Handedness:  Right-handed Dislocation: no     History reviewed. No pertinent past medical history. Past Surgical History  Procedure Laterality Date  . Nasal sinus surgery Left y-3   Family History  Problem Relation Age of Onset  . Hypertension Father   . Asthma Son    History  Substance Use Topics  . Smoking status: Former Smoker    Types: Cigarettes    Quit date: 08/07/2006  . Smokeless tobacco: Not on file  . Alcohol Use: No     Comment: Former heavy ETOH intake. Quit 7 years ago    Review of Systems  All other systems reviewed and are negative.   Allergies  Strawberry  Home Medications   Prior to Admission medications   Medication Sig Start Date End Date Taking? Authorizing Provider  Chlorpheniramine-Pseudoeph 12-120 MG TB12 Take 1 tablet by mouth 2 (two) times daily. Patient not taking: Reported on 08/28/2014 08/23/11   Reuben Likes, MD  dicyclomine (BENTYL) 20 MG tablet Take 1 every 8 hours as needed for abdominal pain. Patient not taking: Reported on 08/28/2014 03/20/12   Reuben Likes, MD  fexofenadine (ALLEGRA) 180 MG tablet Take 1 tablet (180 mg total) by mouth daily. 12/28/11 12/27/12  Linna Hoff, MD  fluticasone (FLONASE) 50 MCG/ACT nasal spray Place 2 sprays into the nose  daily. 08/23/11 08/22/12  Reuben Likes, MD  fluticasone (FLONASE) 50 MCG/ACT nasal spray Place 1 spray into the nose 2 (two) times daily. 12/28/11 12/27/12  Linna Hoff, MD  omeprazole (PRILOSEC) 20 MG capsule Take 1 capsule (20 mg total) by mouth daily. 09/08/14 09/08/15  Gwyneth Sprout, MD  ondansetron (ZOFRAN) 4 MG tablet Take 1 tablet (4 mg total) by mouth every 6 (six) hours. 09/08/14   Gwyneth Sprout, MD  predniSONE (DELTASONE) 10 MG tablet Take 4 tabs daily for 4 days, 3 tabs daily for 4 days, 2 tabs daily for 4 days, then 1 tab daily for 4 days. Patient not taking: Reported on 08/28/2014 10/28/11   Reuben Likes, MD   BP 137/98 mmHg  Pulse 65  Temp(Src) 98.5 F (36.9 C) (Oral)  Resp 16  SpO2 96% Physical Exam  Constitutional: He is oriented to person, place, and time. He appears well-developed and well-nourished. No distress.  HENT:  Head: Normocephalic and atraumatic.  Cardiovascular: Normal rate.   Pulmonary/Chest: Effort normal.  Musculoskeletal:       Right shoulder: He exhibits tenderness. He exhibits normal range of motion, no bony tenderness, no swelling, no effusion, no crepitus, no deformity, no laceration, normal pulse and normal strength.       Arms: Right shoulder discomfort with overhead reach and extension with internal rotation CSM exam of RUE intact  Neurological: He is alert and oriented to person, place, and time.  Skin: Skin is warm and dry.  Psychiatric: He has a normal mood and affect. His behavior is normal.  Nursing note and vitals reviewed.   ED Course  Procedures (including critical care time) Labs Review Labs Reviewed - No data to display  Imaging Review [place] Abdomen Limited  09/08/2014   CLINICAL DATA:  Right upper quadrant abdominal pain.  EXAM: US ABDOMEN LIMITED - RIGHT UPPER QUADRANT  COMPARISON:  Plain films 03/20/2012  FINDINGS: Gallbladder:  No gallstones or wall thickening visualized. No sonographic Murphy sign noted.  Common bile duct:   Diameter: Normal caliber, 3 mm  Liver:  No focal lesion identified. Within normal limits in parenchymal echogenicity.  IMPRESSION: Unremarkable right upper quadrant ultrasound.   Electronically Signed   By: Charlett NoseKevin  Dover M.D.   On: 09/08/2014 15:05     MDM   1. Chronic shoulder pain, right    Diclofenac as directed. Advised to locate PCP for follow up should symptoms persist and for any additional routine medical needs.   Mathis FareJennifer Lee H HelperPresson, GeorgiaPA 09/10/14 [phone]

## 2014-09-10 NOTE — ED Notes (Signed)
C/o right shoulder pain onset x1 month; p/u heavy 5 gallon jug of water Also reports he inj his right shoulder in 2014 Pain increases w/activity Alert, no signs of acute distress.

## 2014-09-13 MED ORDER — IBUPROFEN 600 MG PO TABS: 600.0000 mg | ORAL_TABLET | Freq: Four times a day (QID) | ORAL | Status: DC | PRN

## 2014-09-30 ENCOUNTER — Emergency Department (INDEPENDENT_AMBULATORY_CARE_PROVIDER_SITE_OTHER)
Admission: EM | Admit: 2014-09-30 | Discharge: 2014-09-30 | Disposition: A | Discharge status: Home / Self Care | Payer: Medicaid Other | Source: Home / Self Care | Attending: Family Medicine | Admitting: Family Medicine

## 2014-09-30 ENCOUNTER — Encounter (HOSPITAL_COMMUNITY): Payer: Self-pay | Admitting: *Deleted

## 2014-09-30 DIAGNOSIS — S39012A Strain of muscle, fascia and tendon of lower back, initial encounter: Secondary | ICD-10-CM

## 2014-09-30 DIAGNOSIS — S0093XA Contusion of unspecified part of head, initial encounter: Secondary | ICD-10-CM

## 2014-09-30 DIAGNOSIS — M542 Cervicalgia: Secondary | ICD-10-CM

## 2014-09-30 MED ORDER — TRAMADOL HCL 50 MG PO TABS: 50.0000 mg | ORAL_TABLET | Freq: Four times a day (QID) | ORAL | Status: DC | PRN

## 2014-09-30 MED ORDER — DICLOFENAC POTASSIUM 50 MG PO TABS: 50.0000 mg | ORAL_TABLET | Freq: Three times a day (TID) | ORAL | Status: DC
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
## 2014-09-30 NOTE — Discharge Instructions (Signed)
Cervical Sprain A cervical sprain is when the tissues (ligaments) that hold the neck bones in place stretch or tear. HOME CARE   Put ice on the injured area.  Put ice in a plastic bag.  Place a towel between your skin and the bag.  Leave the ice on for 15-20 minutes, 3-4 times a day.  You may have been given a collar to wear. This collar keeps your neck from moving while you heal.  Do not take the collar off unless told by your doctor.  If you have long hair, keep it outside of the collar.  Ask your doctor before changing the position of your collar. You may need to change its position over time to make it more comfortable.  If you are allowed to take off the collar for cleaning or bathing, follow your doctor's instructions on how to do it safely.  Keep your collar clean by wiping it with mild soap and water. Dry it completely. If the collar has removable pads, remove them every 1-2 days to hand wash them with soap and water. Allow them to air dry. They should be dry before you wear them in the collar.  Do not drive while wearing the collar.  Only take medicine as told by your doctor.  Keep all doctor visits as told.  Keep all physical therapy visits as told.  Adjust your work station so that you have good posture while you work.  Avoid positions and activities that make your problems worse.  Warm up and stretch before being active. GET HELP IF:  Your pain is not controlled with medicine.  You cannot take less pain medicine over time as planned.  Your activity level does not improve as expected. GET HELP RIGHT AWAY IF:   You are bleeding.  Your stomach is upset.  You have an allergic reaction to your medicine.  You develop new problems that you cannot explain.  You lose feeling (become numb) or you cannot move any part of your body (paralysis).  You have tingling or weakness in any part of your body.  Your symptoms get worse. Symptoms include:  Pain,  soreness, stiffness, puffiness (swelling), or a burning feeling in your neck.  Pain when your neck is touched.  Shoulder or upper back pain.  Limited ability to move your neck.  Headache.  Dizziness.  Your hands or arms feel week, lose feeling, or tingle.  Muscle spasms.  Difficulty swallowing or chewing. MAKE SURE YOU:   Understand these instructions.  Will watch your condition.  Will get help right away if you are not doing well or get worse. Document Released: 01/11/2008 Document Revised: 03/27/2013 Document Reviewed: 01/30/2013 [facility] Patient Information 2015 [place], [place]. This information is not intended to replace advice given to you by your health care provider. Make sure you discuss any questions you have with your health care provider.  Back Pain, Adult Back pain is very common. The pain often gets better over time. The cause of back pain is usually not dangerous. Most people can learn to manage their back pain on their own.  HOME CARE   Stay active. Start with short walks on flat ground if you can. Try to walk farther each day.  Do not sit, drive, or stand in one place for more than 30 minutes. Do not stay in bed.  Do not avoid exercise or work. Activity can help your back heal faster.  Be careful when you bend or lift an object. [employer]  at your knees, keep the object close to you, and do not twist.  Sleep on a firm mattress. Lie on your side, and bend your knees. If you lie on your back, put a pillow under your knees.  Only take medicines as told by your doctor.  Put ice on the injured area.  Put ice in a plastic bag.  Place a towel between your skin and the bag.  Leave the ice on for 15-20 minutes, 03-04 times a day for the first 2 to 3 days. After that, you can switch between ice and heat packs.  Ask your doctor about back exercises or massage.  Avoid feeling anxious or stressed. Find good ways to deal with stress, such as exercise. GET HELP RIGHT  AWAY IF:   Your pain does not go away with rest or medicine.  Your pain does not go away in 1 week.  You have new problems.  You do not feel well.  The pain spreads into your legs.  You cannot control when you poop (bowel movement) or pee (urinate).  Your arms or legs feel weak or lose feeling (numbness).  You feel sick to your stomach (nauseous) or throw up (vomit).  You have belly (abdominal) pain.  You feel like you may pass out (faint). MAKE SURE YOU:   Understand these instructions.  Will watch your condition.  Will get help right away if you are not doing well or get worse. Document Released: 01/11/2008 Document Revised: 10/17/2011 Document Reviewed: 11/26/2013 [facility] Patient Information 2015 [place], [place]. This information is not intended to replace advice given to you by your health care provider. Make sure you discuss any questions you have with your health care provider.  Contusion A contusion is a deep bruise. Contusions happen when an injury causes bleeding under the skin. Signs of bruising include pain, puffiness (swelling), and discolored skin. The contusion may turn blue, purple, or yellow. HOME CARE   Put ice on the injured area.  Put ice in a plastic bag.  Place a towel between your skin and the bag.  Leave the ice on for 15-20 minutes, 03-04 times a day.  Only take medicine as told by your doctor.  Rest the injured area.  If possible, raise (elevate) the injured area to lessen puffiness. GET HELP RIGHT AWAY IF:   You have more bruising or puffiness.  You have pain that is getting worse.  Your puffiness or pain is not helped by medicine. MAKE SURE YOU:   Understand these instructions.  Will watch your condition.  Will get help right away if you are not doing well or get worse. Document Released: 01/11/2008 Document Revised: 10/17/2011 Document Reviewed: 05/30/2011 [facility] Patient Information 2015 [place], [place]. This information is  not intended to replace advice given to you by your health care provider. Make sure you discuss any questions you have with your health care provider.  Facial or Scalp Contusion A facial or scalp contusion is a deep bruise on the face or head. Injuries to the face and head generally cause a lot of swelling, especially around the eyes. Contusions are the result of an injury that caused bleeding under the skin. The contusion may turn blue, purple, or yellow. Minor injuries will give you a painless contusion, but more severe contusions may stay painful and swollen for a few weeks.  CAUSES  A facial or scalp contusion is caused by a blunt injury or trauma to the face or head area.  SIGNS AND  SYMPTOMS   Swelling of the injured area.   Discoloration of the injured area.   Tenderness, soreness, or pain in the injured area.  DIAGNOSIS  The diagnosis can be made by taking a medical history and doing a physical exam. An X-ray exam, CT scan, or MRI may be needed to determine if there are any associated injuries, such as broken bones (fractures). TREATMENT  Often, the best treatment for a facial or scalp contusion is applying cold compresses to the injured area. Over-the-counter medicines may also be recommended for pain control.  HOME CARE INSTRUCTIONS   Only take over-the-counter or prescription medicines as directed by your health care provider.   Apply ice to the injured area.   Put ice in a plastic bag.   Place a towel between your skin and the bag.   Leave the ice on for 20 minutes, 2-3 times a day.  SEEK MEDICAL CARE IF:  You have bite problems.   You have pain with chewing.   You are concerned about facial defects. SEEK IMMEDIATE MEDICAL CARE IF:  You have severe pain or a headache that is not relieved by medicine.   You have unusual sleepiness, confusion, or personality changes.   You throw up (vomit).   You have a persistent nosebleed.   You have double vision  or blurred vision.   You have fluid drainage from your nose or ear.   You have difficulty walking or using your arms or legs.  MAKE SURE YOU:   Understand these instructions.  Will watch your condition.  Will get help right away if you are not doing well or get worse. Document Released: 09/01/2004 Document Revised: 05/15/2013 Document Reviewed: 03/07/2013 [facility] Patient Information 2015 [place], [place]. This information is not intended to replace advice given to you by your health care provider. Make sure you discuss any questions you have with your health care provider.  Head Injury You have a head injury. Headaches and throwing up (vomiting) are common after a head injury. It should be easy to wake up from sleeping. Sometimes you must stay in the hospital. Most problems happen within the first 24 hours. Side effects may occur up to 7-10 days after the injury.  WHAT ARE THE TYPES OF HEAD INJURIES? Head injuries can be as minor as a bump. Some head injuries can be more severe. More severe head injuries include:  A jarring injury to the brain (concussion).  A bruise of the brain (contusion). This mean there is bleeding in the brain that can cause swelling.  A cracked skull (skull fracture).  Bleeding in the brain that collects, clots, and forms a bump (hematoma). WHEN SHOULD I GET HELP RIGHT AWAY?   You are confused or sleepy.  You cannot be woken up.  You feel sick to your stomach (nauseous) or keep throwing up (vomiting).  Your dizziness or unsteadiness is getting worse.  You have very bad, lasting headaches that are not helped by medicine. Take medicines only as told by your doctor.  You cannot use your arms or legs like normal.  You cannot walk.  You notice changes in the black spots in the center of the colored part of your eye (pupil).  You have clear or bloody fluid coming from your nose or ears.  You have trouble seeing. During the next 24 hours after the  injury, you must stay with someone who can watch you. This person should get help right away (call 911 in the [place] if  you start to shake and are not able to control it (have seizures), you pass out, or you are unable to wake up. HOW CAN I PREVENT A HEAD INJURY IN THE FUTURE?  Wear seat belts.  Wear a helmet while bike riding and playing sports like football.  Stay away from dangerous activities around the house. WHEN CAN I RETURN TO NORMAL ACTIVITIES AND ATHLETICS? See your doctor before doing these activities. You should not do normal activities or play contact sports until 1 week after the following symptoms have stopped:  Headache that does not go away.  Dizziness.  Poor attention.  Confusion.  Memory problems.  Sickness to your stomach or throwing up.  Tiredness.  Fussiness.  Bothered by bright lights or loud noises.  Anxiousness or depression.  Restless sleep. MAKE SURE YOU:   Understand these instructions.  Will watch your condition.  Will get help right away if you are not doing well or get worse. Document Released: 07/07/2008 Document Revised: 12/09/2013 Document Reviewed: 04/01/2013 [facility] Patient Information 2015 [place]. This information is not intended to replace advice given to you by your health care provider. Make sure you discuss any questions you have with your health care provider.
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
## 2016-02-03 ENCOUNTER — Emergency Department (HOSPITAL_COMMUNITY)
Admission: EM | Admit: 2016-02-03 | Discharge: 2016-02-03 | Disposition: A | Discharge status: Home / Self Care | Payer: Medicaid Other | Attending: Emergency Medicine | Admitting: Emergency Medicine

## 2016-02-03 ENCOUNTER — Encounter (HOSPITAL_COMMUNITY): Payer: Self-pay | Admitting: Emergency Medicine

## 2016-02-03 DIAGNOSIS — K625 Hemorrhage of anus and rectum: Secondary | ICD-10-CM | POA: Diagnosis present

## 2016-02-03 DIAGNOSIS — Z87891 Personal history of nicotine dependence: Secondary | ICD-10-CM | POA: Insufficient documentation

## 2016-02-03 DIAGNOSIS — K640 First degree hemorrhoids: Secondary | ICD-10-CM | POA: Diagnosis not present

## 2016-02-03 LAB — CBC
HEMATOCRIT: 43.6 % (ref 39.0–52.0)
HEMOGLOBIN: 14.6 g/dL (ref 13.0–17.0)
MCH: 28 pg (ref 26.0–34.0)
MCHC: 33.5 g/dL (ref 30.0–36.0)
MCV: 83.5 fL (ref 78.0–100.0)
Platelets: 295 10*3/uL (ref 150–400)
RBC: 5.22 MIL/uL (ref 4.22–5.81)
RDW: 13.5 % (ref 11.5–15.5)
WBC: 9.8 10*3/uL (ref 4.0–10.5)

## 2016-02-03 LAB — COMPREHENSIVE METABOLIC PANEL
ALBUMIN: 4.1 g/dL (ref 3.5–5.0)
ALT: 50 U/L (ref 17–63)
ANION GAP: 9 (ref 5–15)
AST: 41 U/L (ref 15–41)
Alkaline Phosphatase: 66 U/L (ref 38–126)
BUN: 13 mg/dL (ref 6–20)
CHLORIDE: 104 mmol/L (ref 101–111)
CO2: 24 mmol/L (ref 22–32)
Calcium: 8.9 mg/dL (ref 8.9–10.3)
Creatinine, Ser: 1.36 mg/dL — ABNORMAL HIGH (ref 0.61–1.24)
GFR calc non Af Amer: >60 mL/min (ref >60)
GLUCOSE: 93 mg/dL (ref 65–99)
Potassium: 3.5 mmol/L (ref 3.5–5.1)
SODIUM: 137 mmol/L (ref 135–145)
Total Bilirubin: 0.5 mg/dL (ref 0.3–1.2)
Total Protein: 7.6 g/dL (ref 6.5–8.1)

## 2016-02-03 LAB — ABO/RH: ABO/RH(D): A POS

## 2016-02-03 LAB — TYPE AND SCREEN
ABO/RH(D): A POS
Antibody Screen: NEGATIVE

## 2016-02-03 MED ORDER — HYDROCORTISONE ACETATE 25 MG RE SUPP
25.0000 mg | Freq: Two times a day (BID) | RECTAL | Status: DC
Start: 2016-02-03 — End: 2017-07-20

## 2016-02-03 NOTE — Discharge Instructions (Signed)
Hemorrhoids  Hemorrhoids are puffy (swollen) veins around the rectum or anus. Hemorrhoids can cause pain, itching, bleeding, or irritation.  HOME CARE  · Eat foods with fiber, such as whole grains, beans, nuts, fruits, and vegetables. Ask your doctor about taking products with added fiber in them (fiber supplements).   · Drink enough fluid to keep your pee (urine) clear or pale yellow.  · Exercise often.  · Go to the bathroom when you have the urge to poop. Do not wait.  · Avoid straining to poop (bowel movement).  · Keep the butt area dry and clean. Use wet toilet paper or moist paper towels.  · Medicated creams and medicine inserted into the anus (anal suppository) may be used or applied as told.  · Only take medicine as told by your doctor.  · Take a warm water bath (sitz bath) for 15-20 minutes to ease pain. Do this 3-4 times a day.  · Place ice packs on the area if it is tender or puffy. Use the ice packs between the warm water baths.    Put ice in a plastic bag.    Place a towel between your skin and the bag.    Leave the ice on for 15-20 minutes, 03-04 times a day.  · Do not use a donut-shaped pillow or sit on the toilet for a long time.  GET HELP RIGHT AWAY IF:   · You have more pain that is not controlled by treatment or medicine.  · You have bleeding that will not stop.  · You have trouble or are unable to poop (bowel movement).  · You have pain or puffiness outside the area of the hemorrhoids.  MAKE SURE YOU:   · Understand these instructions.  · Will watch your condition.  · Will get help right away if you are not doing well or get worse.     This information is not intended to replace advice given to you by your health care provider. Make sure you discuss any questions you have with your health care provider.     Document Released: 05/03/2008 Document Revised: 07/11/2012 Document Reviewed: 06/05/2012  [employer] Interactive Patient Education ©2016 [employer]

## 2016-02-03 NOTE — ED Provider Notes (Signed)
CSN: [id number]     Arrival date & time 02/03/16  1641 History   First MD Initiated Contact with Patient 02/03/16 1857     Chief Complaint  Patient presents with  . Rectal Bleeding     (Consider location/radiation/quality/duration/timing/severity/associated sxs/prior Treatment) Patient is a 30 y.o. male presenting with hematochezia. The history is provided by the patient.  Rectal Bleeding Quality:  Bright red Amount:  Moderate Duration:  12 months Timing:  Constant Progression:  Worsening Chronicity:  New Context: hemorrhoids   Context: not anal fissures, not anal penetration and not foreign body   Similar prior episodes: yes   Relieved by:  Nothing Worsened by:  Nothing tried Ineffective treatments:  None tried Associated symptoms: no abdominal pain, no fever and no vomiting    30 yo M With a chief complaint of rectal bleeding. Patient having bright red blood per rectum for the past year. Usually just trace on his toilet paper but now having a little bit more. Also having some burning down in his rectum that he feels like is mildly worse than his baseline. He is not tried anything for this recently though has a history of hemorrhoids.  History reviewed. No pertinent past medical history. Past Surgical History  Procedure Laterality Date  . Nasal sinus surgery Left y-3   Family History  Problem Relation Age of Onset  . Hypertension Father   . Asthma Son    Social History  Substance Use Topics  . Smoking status: Former Smoker    Types: Cigarettes    Quit date: 08/07/2006  . Smokeless tobacco: None  . Alcohol Use: No     Comment: Former heavy ETOH intake. Quit 7 years ago    Review of Systems  Constitutional: Negative for fever and chills.  HENT: Negative for congestion and facial swelling.   Eyes: Negative for discharge and visual disturbance.  Respiratory: Negative for shortness of breath.   Cardiovascular: Negative for chest pain and palpitations.   Gastrointestinal: Positive for blood in stool, hematochezia and rectal pain. Negative for vomiting, abdominal pain and diarrhea.  Musculoskeletal: Negative for myalgias and arthralgias.  Skin: Negative for color change and rash.  Neurological: Negative for tremors, syncope and headaches.  Psychiatric/Behavioral: Negative for confusion and dysphoric mood.      Allergies  Strawberry extract  Home Medications   Prior to Admission medications   Medication Sig Start Date End Date Taking? Authorizing Provider  hydrocortisone (ANUSOL-HC) 25 MG suppository Place 1 suppository (25 mg total) rectally 2 (two) times daily. For 7 days 02/03/16   Melene Planan Latronda Spink, DO   BP 146/93 mmHg  Pulse 61  Temp(Src) 98 F (36.7 C) (Oral)  Resp 16  Ht 5\' 3"  (1.6 m)  Wt 155 lb (70.308 kg)  BMI 27.46 kg/m2  SpO2 99% Physical Exam  Constitutional: He is oriented to person, place, and time. He appears well-developed and well-nourished.  HENT:  Head: Normocephalic and atraumatic.  Eyes: EOM are normal. Pupils are equal, round, and reactive to light.  Neck: Normal range of motion. Neck supple. No JVD present.  Cardiovascular: Normal rate and regular rhythm.  Exam reveals no gallop and no friction rub.   No murmur heard. Pulmonary/Chest: No respiratory distress. He has no wheezes.  Abdominal: He exhibits no distension. There is no tenderness. There is no rebound and no guarding.  Genitourinary:  Small hemorrhoid to the right side around 3:00. 3 small erosions noted. No actively bleeding. No internal hemorrhoids visualized or felt. No fissures.  Musculoskeletal: Normal range of motion.  Neurological: He is alert and oriented to person, place, and time.  Skin: No rash noted. No pallor.  Psychiatric: He has a normal mood and affect. His behavior is normal.  Nursing note and vitals reviewed.   ED Course  Procedures (including critical care time) Labs Review Labs Reviewed  COMPREHENSIVE METABOLIC PANEL -  Abnormal; Notable for the following:    Creatinine, Ser 1.36 (*)    All other components within normal limits  CBC  POC OCCULT BLOOD, ED  TYPE AND SCREEN  ABO/RH    Imaging Review No results found. I have personally reviewed and evaluated these images and lab results as part of my medical decision-making.   EKG Interpretation None      MDM   Final diagnoses:  Rectal bleeding  First degree hemorrhoids    30 yo M With a small hemorrhoid. Discharge home with Anusol. PCP follow-up.  9:03 PM:  I have discussed the diagnosis/risks/treatment options with the patient and family and believe the pt to be eligible for discharge home to follow-up with PCP. We also discussed returning to the ED immediately if new or worsening sx occur. We discussed the sx which are most concerning (e.g., sudden worsening pain, fever, inability to tolerate by mouth) that necessitate immediate return. Medications administered to the patient during their visit and any new prescriptions provided to the patient are listed below.  Medications given during this visit Medications - No data to display  Discharge Medication List as of 02/03/2016  7:57 PM    START taking these medications   Details  hydrocortisone (ANUSOL-HC) 25 MG suppository Place 1 suppository (25 mg total) rectally 2 (two) times daily. For 7 days, Starting 02/03/2016, Until Discontinued, Print        The patient appears reasonably screen and/or stabilized for discharge and I doubt any other medical condition or other [facility] requiring further screening, evaluation, or treatment in the ED at this time prior to discharge.      Melene Planan Karcyn Menn, DO 02/03/16 2103

## 2016-02-03 NOTE — ED Notes (Signed)
Pt states over the last year sometimes when he goes to the bathroom he will notice some blood with his stools but it is and off and on occurrence. For the last two days pt states he has been very painful and tender in his rectum and having bright red blood with every BM. Pt states it is very painful in his bottom to even sit.

## 2016-07-24 IMAGING — US US ABDOMEN LIMITED
1 series · 14 of 25 positions shown · non-contrast
Comparison: Plain films 03/20/2012

CLINICAL DATA: Right upper quadrant abdominal pain.

EXAM:
US ABDOMEN LIMITED - RIGHT UPPER QUADRANT

[Series 1: us abdomen limited · 0.21mm/px · 14 of 48 slices shown]
[im 1/48]
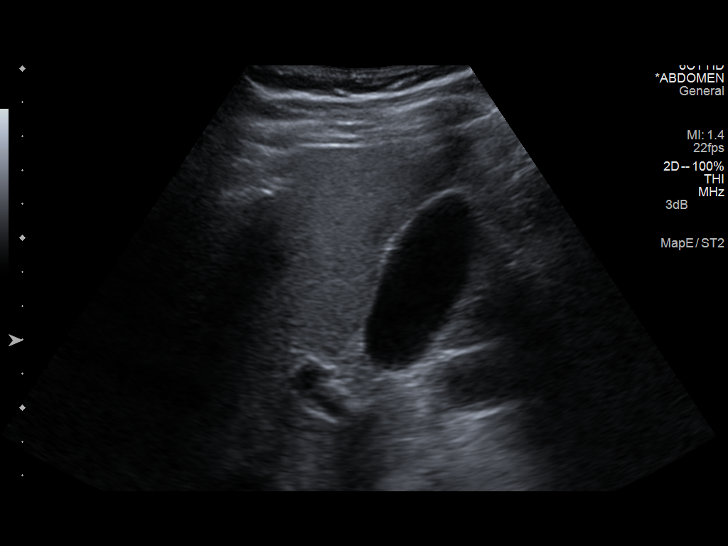
[im 4/48]
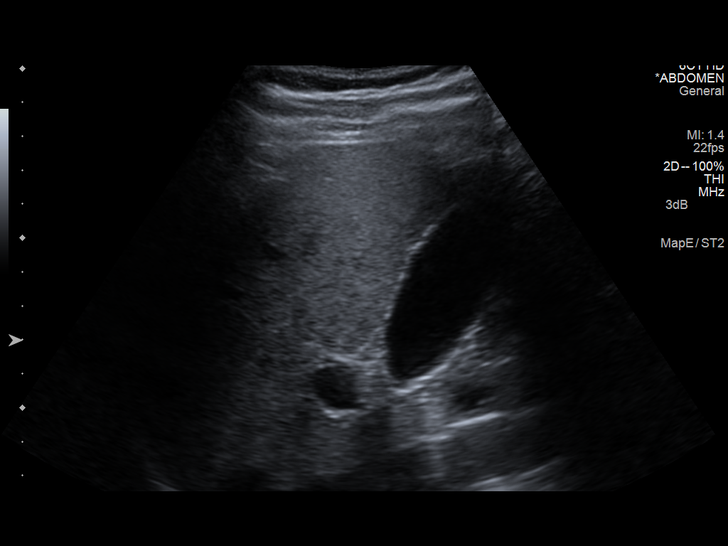
[im 8/48]
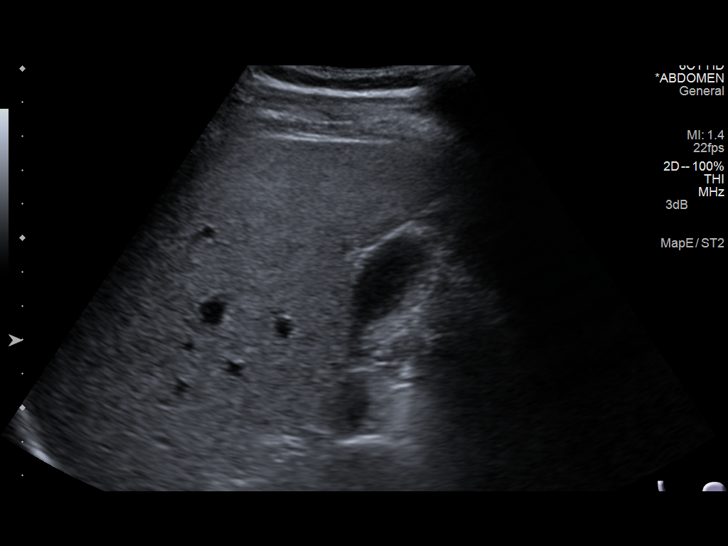
[im 12/48]
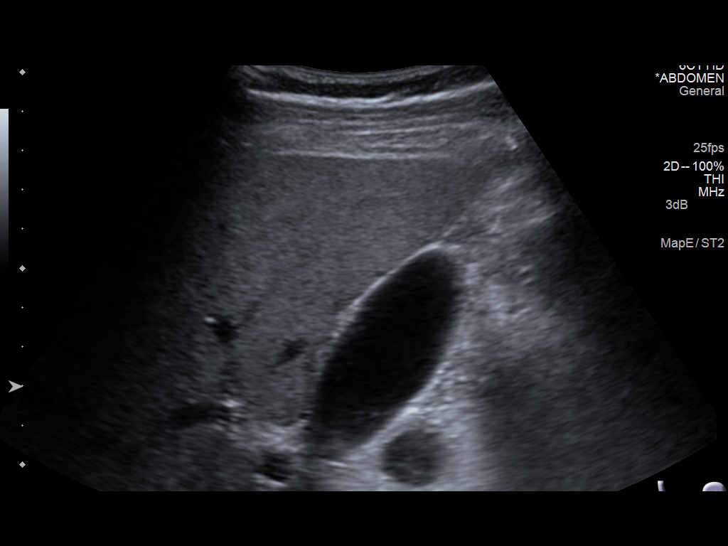
[im 16/48]
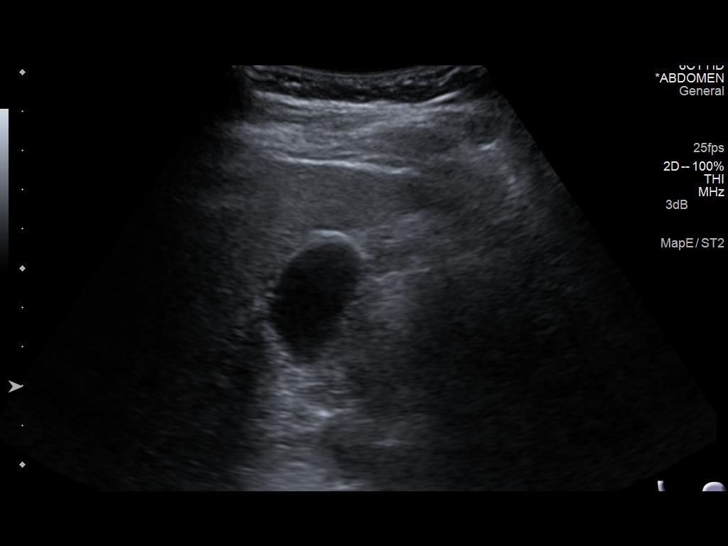
[im 18/48]
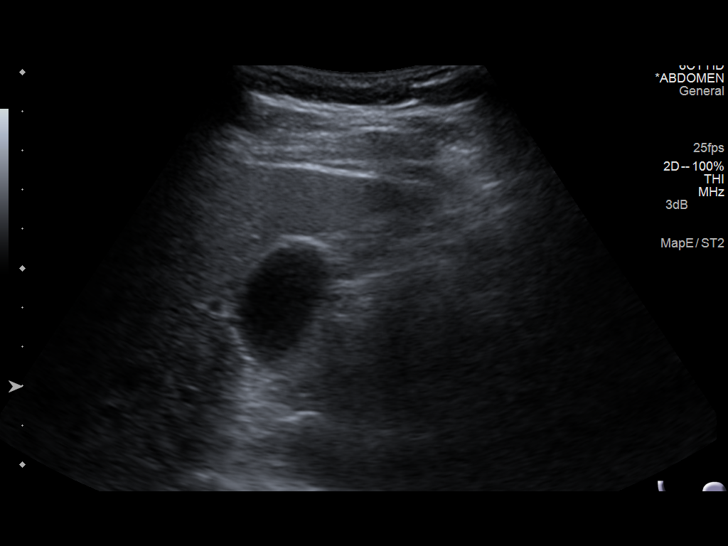
[im 22/48]
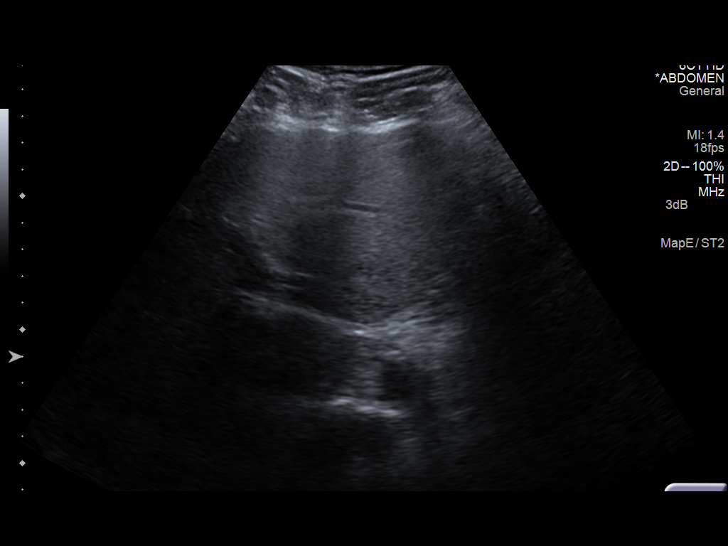
[im 26/48]
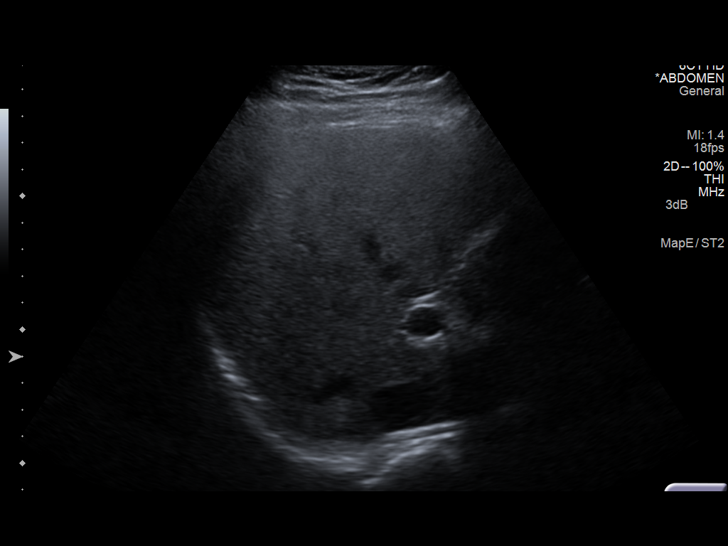
[im 30/48]
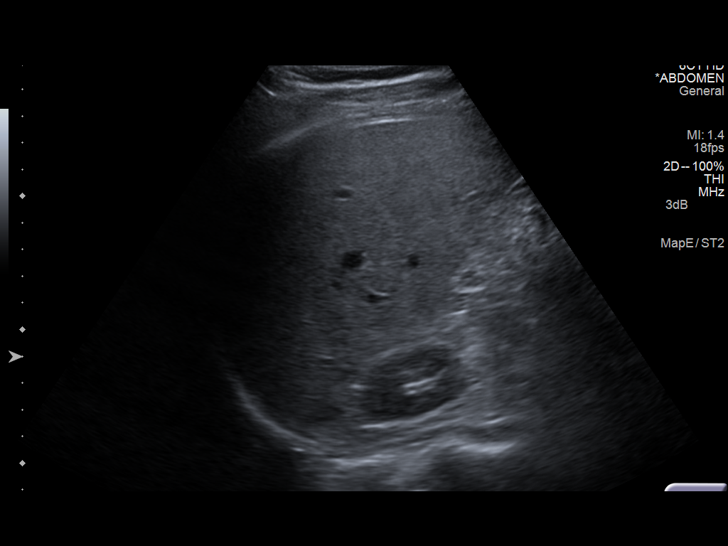
[im 32/48]
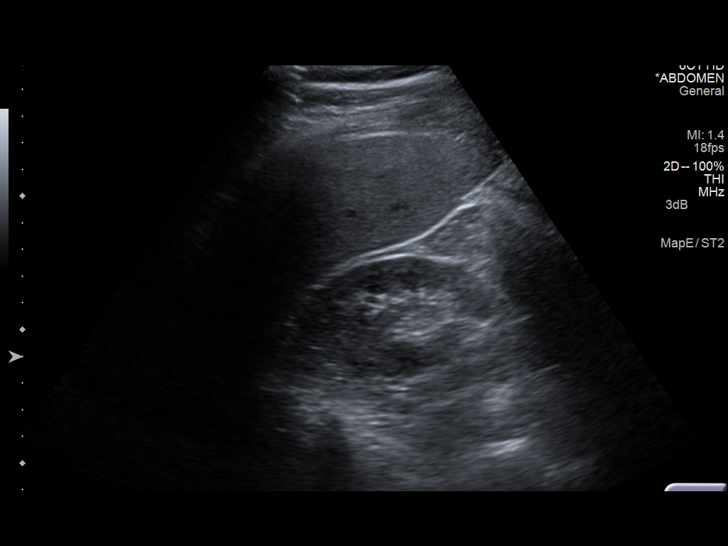
[im 36/48]
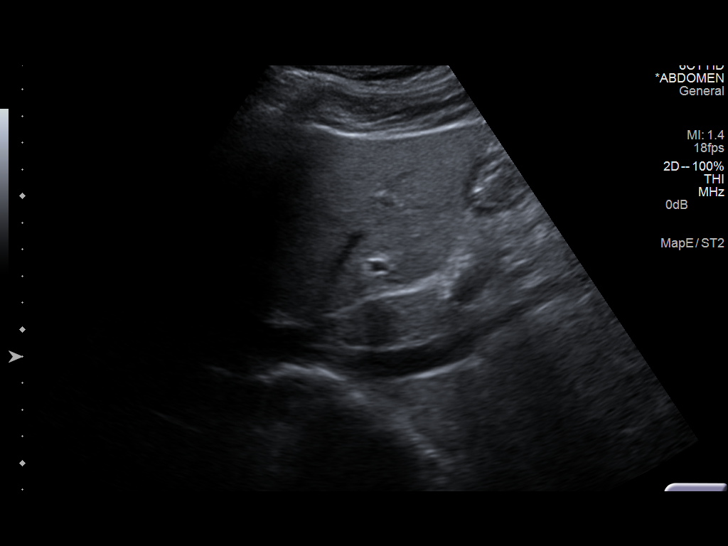
[im 40/48]
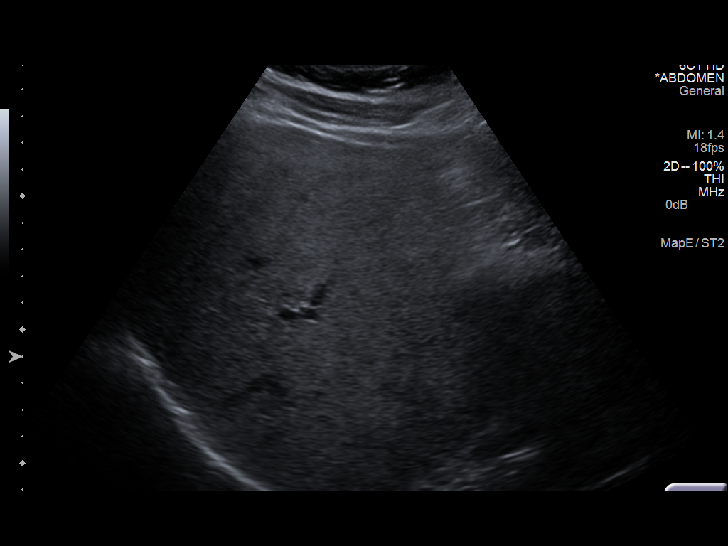
[im 44/48]
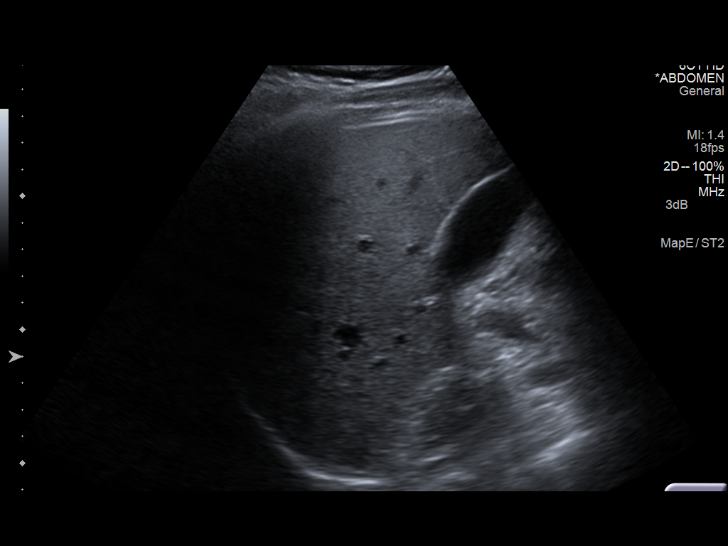
[im 48/48]
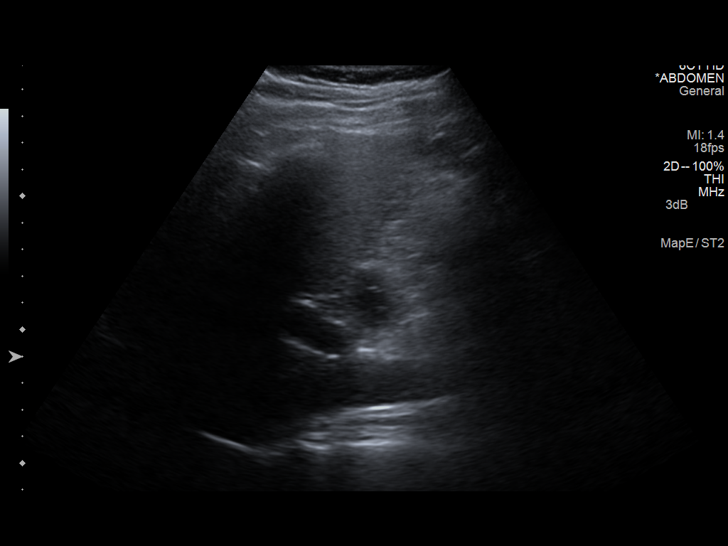

[14 of 25 positions shown; findings below may reference images not displayed]

FINDINGS: Gallbladder:

No gallstones or wall thickening visualized. No sonographic Murphy
sign noted.

Common bile duct:

Diameter: Normal caliber, 3 mm

Liver:

No focal lesion identified. Within normal limits in parenchymal
echogenicity.
IMPRESSION: Unremarkable right upper quadrant ultrasound.

## 2016-10-12 ENCOUNTER — Other Ambulatory Visit (HOSPITAL_BASED_OUTPATIENT_CLINIC_OR_DEPARTMENT_OTHER): Payer: Self-pay

## 2016-10-12 DIAGNOSIS — R0683 Snoring: Secondary | ICD-10-CM

## 2016-11-25 ENCOUNTER — Ambulatory Visit (HOSPITAL_BASED_OUTPATIENT_CLINIC_OR_DEPARTMENT_OTHER): Payer: Medicaid Other | Attending: Otolaryngology | Admitting: Internal Medicine

## 2016-11-25 VITALS — Ht 65.0 in | Wt 180.0 lb

## 2016-11-25 DIAGNOSIS — R0683 Snoring: Secondary | ICD-10-CM | POA: Diagnosis not present

## 2016-11-29 ENCOUNTER — Encounter (HOSPITAL_COMMUNITY): Payer: Self-pay | Admitting: Emergency Medicine

## 2016-11-29 ENCOUNTER — Emergency Department (HOSPITAL_COMMUNITY)
Admission: EM | Admit: 2016-11-29 | Discharge: 2016-11-29 | Disposition: A | Discharge status: Home / Self Care | Payer: Medicaid Other | Attending: Emergency Medicine | Admitting: Emergency Medicine

## 2016-11-29 DIAGNOSIS — L989 Disorder of the skin and subcutaneous tissue, unspecified: Secondary | ICD-10-CM | POA: Insufficient documentation

## 2016-11-29 DIAGNOSIS — Z87891 Personal history of nicotine dependence: Secondary | ICD-10-CM | POA: Insufficient documentation

## 2016-11-29 NOTE — Discharge Instructions (Signed)
You will need to follow up with Dr. Margo Aye, the dermatologist, for further evaluation of the area on your scalp. Call and schedule an appointment.

## 2016-11-29 NOTE — ED Provider Notes (Signed)
[facility]-EMERGENCY DEPT Provider Note   CSN: [id number] Arrival date & time: 11/29/16  2020  By signing my name below, I, Teofilo Pod, attest that this documentation has been prepared under the direction and in the presence of Kerrie Buffalo, NP. Electronically Signed: Teofilo Pod, ED Scribe. 11/29/2016. 9:17 PM.    History   Chief Complaint Chief Complaint  Patient presents with  . Dry itchy / scabbed scalp   The history is provided by the patient. No language interpreter was used.   HPI Comments:  Derrick Hawkins is a 31 y.o. male who presents to the Emergency Department complaining of worsening skin irritation on his scalp x 4 months. Pt reports a dry, itchy, scabbed area on his scalp that bled when he itched it today. He states that the area has improved and subsequently worsened several times. Pt was seen by his PCP and was given [facility] and cortisone with no relief. Pt denies other associated symptoms.    History reviewed. No pertinent past medical history.  There are no active problems to display for this patient.   Past Surgical History:  Procedure Laterality Date  . NASAL SINUS SURGERY Left y-3       Home Medications    Prior to Admission medications   Medication Sig Start Date End Date Taking? Authorizing Provider  hydrocortisone (ANUSOL-HC) 25 MG suppository Place 1 suppository (25 mg total) rectally 2 (two) times daily. For 7 days 02/03/16   Melene Plan, DO    Family History Family History  Problem Relation Age of Onset  . Hypertension Father   . Asthma Son     Social History Social History  Substance Use Topics  . Smoking status: Former Smoker    Types: Cigarettes    Quit date: 08/07/2006  . Smokeless tobacco: Never Used  . Alcohol use No     Comment: Former heavy ETOH intake. Quit 7 years ago     Allergies   Strawberry extract   Review of Systems Review of Systems  Constitutional: Negative for fever.  HENT: Negative.   Eyes:  Negative for redness.  Respiratory: Negative for shortness of breath.   Gastrointestinal: Negative for nausea and vomiting.  Musculoskeletal: Negative for neck pain.  Skin: Positive for rash.  Neurological: Negative for headaches.  Psychiatric/Behavioral: The patient is not nervous/anxious.      Physical Exam Updated Vital Signs BP (!) 140/104   Pulse 72   Temp 97.9 F (36.6 C) (Oral)   Resp 16   SpO2 96%   Physical Exam  Constitutional: He appears well-developed and well-nourished. No distress.  HENT:  Head: Normocephalic and atraumatic.  There is a 2 cm area to the posterior aspect of the scalp that is dry and crusty. There is no drainage noted at time of exam. Area is not tender on exam.  Eyes: Conjunctivae are normal.  Neck: Neck supple.  Cardiovascular: Normal rate.   Pulmonary/Chest: Effort normal.  Musculoskeletal: Normal range of motion.  Lymphadenopathy:    He has no cervical adenopathy.  Neurological: He is alert.  Skin: Skin is warm and dry.  Psychiatric: He has a normal mood and affect.  Nursing note and vitals reviewed.    ED Treatments / Results  DIAGNOSTIC STUDIES:  Oxygen Saturation is 99% on RA, normal by my interpretation.    COORDINATION OF CARE:  9:15 PM Will refer to dermatology. Discussed treatment plan with pt at bedside and pt agreed to plan.   Labs (all labs  ordered are listed, but only abnormal results are displayed) Labs Reviewed - No data to display Radiology No results found.  Procedures Procedures (including critical care time)  Medications Ordered in ED Medications - No data to display   Initial Impression / Assessment and Plan / ED Course  I have reviewed the triage vital signs and the nursing notes.  Final Clinical Impressions(s) / ED Diagnoses  31 y.o. male with dry patchy area to the posterior scalp stable for d/c without fever, red streaking and does not appear toxic. Patient to continue the medications from his PCP  and f/u with dermatology. Patient agrees with plan.  Final diagnoses:  Scalp lesion    New Prescriptions Discharge Medication List as of 11/29/2016  9:21 PM    I personally performed the services described in this documentation, which was scribed in my presence. The recorded information has been reviewed and is accurate.     [address] [place], NP 11/30/16 2006    Rolan Bucco, MD 12/02/16 [phone]

## 2016-11-29 NOTE — ED Triage Notes (Signed)
Pt. Reports chronic  dry itchy scalp with scabbed area and bled when he scratched it today .

## 2016-12-03 DIAGNOSIS — R0683 Snoring: Secondary | ICD-10-CM

## 2016-12-03 NOTE — Procedures (Signed)
  Patient Name: Derrick Hawkins, Derrick Hawkins Date: 11/25/2016 Gender: Male D.O.B: July 14, 1986 Age (years): 30 Referring Provider: Christia Reading Height (inches): 65 Interpreting Physician: Jetty Duhamel MD, ABSM Weight (lbs): 180 RPSGT: Wylie Hail BMI: 30 MRN: [id number] Neck Size: 16.00 CLINICAL INFORMATION Sleep Study Type: NPSG  Indication for sleep study: Snoring  Epworth Sleepiness Score: 4  SLEEP STUDY TECHNIQUE As per the AASM Manual for the Scoring of Sleep and Associated Events v2.3 (April 2016) with a hypopnea requiring 4% desaturations.  The channels recorded and monitored were frontal, central and occipital EEG, electrooculogram (EOG), submentalis EMG (chin), nasal and oral airflow, thoracic and abdominal wall motion, anterior tibialis EMG, snore microphone, electrocardiogram, and pulse oximetry.  MEDICATIONS Medications self-administered by patient taken the night of the study : none reported  SLEEP ARCHITECTURE The study was initiated at 11:10:52 PM and ended at 5:21:57 AM.  Sleep onset time was 23.2 minutes and the sleep efficiency was 90.2%. The total sleep time was 334.9 minutes.  Stage REM latency was 79.5 minutes.  The patient spent 7.73% of the night in stage N1 sleep, 71.67% in stage N2 sleep, 0.30% in stage N3 and 20.31% in REM.  Alpha intrusion was absent.  Supine sleep was 64.52%.  RESPIRATORY PARAMETERS The overall apnea/hypopnea index (AHI) was 0.2 per hour. There were 0 total apneas, including 0 obstructive, 0 central and 0 mixed apneas. There were 1 hypopneas and 6 RERAs.  The AHI during Stage REM sleep was 0.0 per hour.  AHI while supine was 0.3 per hour.  The mean oxygen saturation was 96.55%. The minimum SpO2 during sleep was 92.00%.  Loud snoring was noted during this study.  CARDIAC DATA The 2 lead EKG demonstrated sinus rhythm. The mean heart rate was 59.08 beats per minute. Other EKG findings include: None.  LEG MOVEMENT DATA The  total PLMS were 0 with a resulting PLMS index of 0.00. Associated arousal with leg movement index was 0.0 .  IMPRESSIONS - No significant obstructive sleep apnea occurred during this study (AHI = 0.2/h). - No significant central sleep apnea occurred during this study (CAI = 0.0/h). - The patient had minimal or no oxygen desaturation during the study (Min O2 = 92.00%) - The patient snored with Loud snoring volume. - No cardiac abnormalities were noted during this study. - Clinically significant periodic limb movements did not occur during sleep. No significant associated arousals.  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 - Sleep hygiene should be reviewed to assess factors that may improve sleep quality. - Weight management and regular exercise should be initiated or continued if appropriate.  [Electronically signed] 12/03/2016 02:25 PM  Jetty Duhamel MD, ABSM Diplomate, American Board of Sleep Medicine   NPI: [id number]  Waymon Budge Diplomate, American Board of Sleep Medicine  ELECTRONICALLY SIGNED ON:  12/03/2016, 2:24 PM [facility] PH: [phone]   FX: [phone] ACCREDITED BY THE AMERICAN ACADEMY OF SLEEP MEDICINE

## 2017-02-26 ENCOUNTER — Emergency Department (HOSPITAL_COMMUNITY)
Admission: EM | Admit: 2017-02-26 | Discharge: 2017-02-26 | Disposition: A | Discharge status: Home / Self Care | Payer: Medicaid Other | Attending: Emergency Medicine | Admitting: Emergency Medicine

## 2017-02-26 ENCOUNTER — Encounter (HOSPITAL_COMMUNITY): Payer: Self-pay | Admitting: Emergency Medicine

## 2017-02-26 DIAGNOSIS — R1084 Generalized abdominal pain: Secondary | ICD-10-CM

## 2017-02-26 DIAGNOSIS — Z87891 Personal history of nicotine dependence: Secondary | ICD-10-CM | POA: Diagnosis not present

## 2017-02-26 DIAGNOSIS — R109 Unspecified abdominal pain: Secondary | ICD-10-CM | POA: Insufficient documentation

## 2017-02-26 LAB — COMPREHENSIVE METABOLIC PANEL
ALK PHOS: 84 U/L (ref 38–126)
ALT: 87 U/L — AB (ref 17–63)
AST: 47 U/L — ABNORMAL HIGH (ref 15–41)
Albumin: 3.7 g/dL (ref 3.5–5.0)
Anion gap: 6 (ref 5–15)
BILIRUBIN TOTAL: 0.7 mg/dL (ref 0.3–1.2)
BUN: 8 mg/dL (ref 6–20)
CALCIUM: 8.7 mg/dL — AB (ref 8.9–10.3)
CO2: 26 mmol/L (ref 22–32)
CREATININE: 0.91 mg/dL (ref 0.61–1.24)
Chloride: 106 mmol/L (ref 101–111)
GFR calc Af Amer: >60 mL/min (ref >60)
Glucose, Bld: 99 mg/dL (ref 65–99)
Potassium: 4 mmol/L (ref 3.5–5.1)
Sodium: 138 mmol/L (ref 135–145)
Total Protein: 7.3 g/dL (ref 6.5–8.1)

## 2017-02-26 LAB — CBC WITH DIFFERENTIAL/PLATELET
BASOS ABS: 0 10*3/uL (ref 0.0–0.1)
Basophils Relative: 0 %
EOS PCT: 3 %
Eosinophils Absolute: 0.2 10*3/uL (ref 0.0–0.7)
HCT: 44.7 % (ref 39.0–52.0)
Hemoglobin: 14.9 g/dL (ref 13.0–17.0)
LYMPHS PCT: 37 %
Lymphs Abs: 2.6 10*3/uL (ref 0.7–4.0)
MCH: 27.7 pg (ref 26.0–34.0)
MCHC: 33.3 g/dL (ref 30.0–36.0)
MCV: 83.1 fL (ref 78.0–100.0)
Monocytes Absolute: 0.3 10*3/uL (ref 0.1–1.0)
Monocytes Relative: 5 %
Neutro Abs: 3.8 10*3/uL (ref 1.7–7.7)
Neutrophils Relative %: 55 %
PLATELETS: 247 10*3/uL (ref 150–400)
RBC: 5.38 MIL/uL (ref 4.22–5.81)
RDW: 13.8 % (ref 11.5–15.5)
WBC: 6.9 10*3/uL (ref 4.0–10.5)

## 2017-02-26 LAB — LIPASE, BLOOD: LIPASE: 26 U/L (ref 11–51)

## 2017-02-26 MED ORDER — DICYCLOMINE HCL 20 MG PO TABS: 20.0000 mg | ORAL_TABLET | Freq: Two times a day (BID) | ORAL | 0 refills | Status: DC

## 2017-02-26 NOTE — ED Provider Notes (Signed)
[facility]-EM[facility] DEPT Provider Note   CSN: [id number] Arrival date & time: 02/26/17  0810     History   Chief Complaint No chief complaint on file.   HPI Waynette Butteryathuel Win is a 31 y.o. male.  HPI  [age] year old generally healthy male presenting with abd pain. Patient report for the past 2 years he has had intermittent generalized abdominal discomfort. He described his pain as a cramping sensation, usually worse after eating. He notice more pain when he drinks milk, eating noodles, or soup. States that the pain usually improves after he have a bowel movement. Pain has been recurrent and he is here requesting to be evaluated for his discomfort. Currently rates pain as 9 out of 10 which has been waxing waning since last night. He denies any associated fever, lightheadedness, chest dizziness, chest pain, shortness of breath, productive cough, hemoptysis, nausea, vomiting, back pain, dysuria, hematuria, hematochezia or melena. He denies any heavy drinking. Patient mentioned he has been seen several times the past without any definitive diagnosis. No specific treatment tried.  No past medical history on file.  There are no active problems to display for this patient.   Past Surgical History:  Procedure Laterality Date  . NASAL SINUS SURGERY Left y-3       Home Medications    Prior to Admission medications   Medication Sig Start Date End Date Taking? Authorizing Provider  hydrocortisone (ANUSOL-HC) 25 MG suppository Place 1 suppository (25 mg total) rectally 2 (two) times daily. For 7 days 02/03/16   Melene PlanFloyd, Dan, DO    Family History Family History  Problem Relation Age of Onset  . Hypertension Father   . Asthma Son     Social History Social History  Substance Use Topics  . Smoking status: Former Smoker    Types: Cigarettes    Quit date: 08/07/2006  . Smokeless tobacco: Never Used  . Alcohol use No     Comment: Former heavy ETOH intake. Quit 7 years ago     Allergies     Strawberry extract   Review of Systems Review of Systems  All other systems reviewed and are negative.    Physical Exam Updated Vital Signs There were no vitals taken for this visit.  Physical Exam  Constitutional: He appears well-developed and well-nourished. No distress.  Patient resting comfortably in no acute discomfort.  HENT:  Head: Atraumatic.  Eyes: Conjunctivae are normal.  Neck: Neck supple.  Cardiovascular: Normal rate and regular rhythm.   Pulmonary/Chest: Effort normal and breath sounds normal.  Abdominal: Soft. Bowel sounds are normal. He exhibits no distension. There is tenderness (Diffuse abdominal tenderness most significant to left lower quadrant without any guarding or rebound tenderness).  Neurological: He is alert.  Skin: No rash noted.  Psychiatric: He has a normal mood and affect.  Nursing note and vitals reviewed.    ED Treatments / Results  Labs (all labs ordered are listed, but only abnormal results are displayed) Labs Reviewed  COMPREHENSIVE METABOLIC PANEL - Abnormal; Notable for the following:       Result Value   Calcium 8.7 (*)    AST 47 (*)    ALT 87 (*)    All other components within normal limits  LIPASE, BLOOD  CBC WITH DIFFERENTIAL/PLATELET    EKG  EKG Interpretation None       Radiology No results found.  Procedures Procedures (including critical care time)  Medications Ordered in ED Medications - No data to display  Initial Impression / Assessment and Plan / ED Course  I have reviewed the triage vital signs and the nursing notes.  Pertinent labs & imaging results that were available during my care of the patient were reviewed by me and considered in my medical decision making (see chart for details).     BP (!) 136/98 (BP Location: Right Arm)   Pulse 65   Temp 97.9 F (36.6 C) (Oral)   Resp 15   Ht 5\' 3"  (1.6 m)   Wt 81.2 kg (179 lb)   SpO2 99%   BMI 31.71 kg/m    Final Clinical Impressions(s) /  ED Diagnoses   Final diagnoses:  Generalized abdominal pain    New Prescriptions New Prescriptions   DICYCLOMINE (BENTYL) 20 MG TABLET    Take 1 tablet (20 mg total) by mouth 2 (two) times daily.   8:37 AM Patient here with recurrent postprandial abdominal pain with associate nausea or vomiting. Pain seems to be brought on by eating. Pain is suggestive of healed bowel syndrome, questionable lactose intolerance or celiac disease. Given the recurrent nature of his symptoms, I have low suspicion for appendicitis, colitis, or other significant infectious etiology. Without any rectal bleeding, low suspicion for inflammatory bowel disease. Will obtain screening labs. Pain medication offer but patient declined.  9:27 AM Patient's labs a mostly reassuring. Mildly elevated AST and ALT, 47 and 87 respectively which seems to be elevated during prior visits. Questionable underlying hepatitis. Normal WBC, normal lipase. At this time, I encouraged patient to follow up with her primary care provider and with the GI specialist for further evaluation of his condition. I believe he may have irritable bowel syndrome. I discussed avoidance of food that can irritate his stomach such as milk product. Encouraged to try a gluten-free diet to see if it helps. Return precaution discussed.   Fayrene Helper, PA-C 02/26/17 6045    Azalia Bilis, MD 02/26/17 1240

## 2017-02-26 NOTE — ED Triage Notes (Signed)
Pt states he has been having abd pain for two years. Denies n/v/d. Feels like the pain when he has to use the bathroom, but the pain does not go away after he has BM

## 2017-02-26 NOTE — Discharge Instructions (Signed)
Please follow up with your primary care provider and also with a stomach specialist for further evaluation of your abdominal pain.  Your pain may be due to Irritable Bowel Syndrome, which may cause by being Lactose Intolerance.  You may also have Celiac disease.  Your GI specialist will help determine the true cause.  Take Bentyl as prescribed.  Return if you have any concerns.  Avoid food that may upset your stomach.

## 2017-02-26 NOTE — ED Notes (Signed)
Pt states when he doesn't eat, he doesn't have any pain. But when  He eats, the pain in his stomach is really bad.

## 2017-07-20 ENCOUNTER — Encounter: Payer: Self-pay | Admitting: Neurology

## 2017-07-21 ENCOUNTER — Telehealth: Payer: Self-pay | Admitting: Neurology

## 2017-07-21 ENCOUNTER — Ambulatory Visit: Payer: Medicaid Other | Admitting: Neurology

## 2017-07-21 NOTE — Telephone Encounter (Signed)
This patient did not show for a new patient appointment today. 

## 2017-07-24 ENCOUNTER — Encounter: Payer: Self-pay | Admitting: Neurology

## 2017-07-24 NOTE — Telephone Encounter (Signed)
Pt called today, he was not going to be able to get to this appt until 2:45. When he called to notify the clinic, the answering service advised him the office was closed and he would not be able to be seen. I advised him that due to the snow the clinic was closed Monday and Tuesday and some of the providers did see patients Friday afternoon (12/14). I apologized he was given the wrong information. Appt has been scheduled with Dr Marjory LiesPenumalli 07/26/17 @ 3:30  FYI

## 2017-07-25 ENCOUNTER — Encounter: Payer: Self-pay | Admitting: *Deleted

## 2017-07-26 ENCOUNTER — Ambulatory Visit: Payer: Medicaid Other | Admitting: Diagnostic Neuroimaging

## 2017-07-26 ENCOUNTER — Encounter: Payer: Self-pay | Admitting: Diagnostic Neuroimaging

## 2017-07-26 DIAGNOSIS — G4489 Other headache syndrome: Secondary | ICD-10-CM | POA: Diagnosis not present

## 2017-07-26 NOTE — Patient Instructions (Signed)
-   check MRI brain 

## 2017-07-26 NOTE — Progress Notes (Signed)
[facility]  PATIENT: Derrick Hawkins DOB: 06/08/1986  REFERRING CLINICIAN: Drucilla ChaletE Jones, MD HISTORY FROM: patient  REASON FOR VISIT: new consult    HISTORICAL  CHIEF COMPLAINT:  Chief Complaint  Patient presents with  . Dizziness    rm 6, New Pt, "dizziness for a couple months, when I look down I get dizzy, headaches, eyes feeling hot"    HISTORY OF PRESENT ILLNESS:   31 year old male here for evaluation of dizziness.  For past 2 months patient has onset of intermittent headaches, nausea, vomiting, photophobia, lightheadedness, floating sensation in his brain.  He feels like his brain is separating from the inside of his skull.  He feels like there is pressure and fluid inside his head and neck.  No specific triggering or aggravating factors.  Symptoms occur on a daily basis for up to 3-4 hours at a time.  No prior history of similar symptoms.  Patient also having problems with memory loss, confusion, difficulty concentrating.  He is having more symptoms while driving.  Sometimes his left leg shakes when he presses the clutch on his car.    REVIEW OF SYSTEMS: Full 14 system review of systems performed and negative with exception of: Headaches snoring dizziness not enough sleep shortness of breath.  ALLERGIES: Allergies  Allergen Reactions  . Strawberry Extract Nausea And Vomiting         HOME MEDICATIONS: Outpatient Medications Prior to Visit  Medication Sig Dispense Refill  . fluticasone (FLONASE) 50 MCG/ACT nasal spray Place 1 spray into both nostrils daily.     No facility-administered medications prior to visit.     PAST MEDICAL HISTORY: Past Medical History:  Diagnosis Date  . Dizziness     PAST SURGICAL HISTORY: Past Surgical History:  Procedure Laterality Date  . NASAL SINUS SURGERY Left y-3    FAMILY HISTORY: Family History  Problem Relation Age of Onset  . Hypertension Father   . Asthma Son     SOCIAL HISTORY:  Social History     Socioeconomic History  . Marital status: Single    Spouse name: Not on file  . Number of children: Not on file  . Years of education: Not on file  . Highest education level: Not on file  Social Needs  . Financial resource strain: Not on file  . Food insecurity - worry: Not on file  . Food insecurity - inability: Not on file  . Transportation needs - medical: Not on file  . Transportation needs - non-medical: Not on file  Occupational History  . Not on file  Tobacco Use  . Smoking status: Former Smoker    Types: Cigarettes    Last attempt to quit: 08/07/2006    Years since quitting: 10.9  . Smokeless tobacco: Never Used  Substance and Sexual Activity  . Alcohol use: No    Comment: Former heavy ETOH intake. Quit 7 years ago  . Drug use: No  . Sexual activity: Not on file  Other Topics Concern  . Not on file  Social History Narrative   Lives with wife, son   Unemployed   High school   No caffeine     PHYSICAL EXAM  GENERAL EXAM/CONSTITUTIONAL: Vitals:  Vitals:   07/26/17 1512  BP: (!) 153/94  Pulse: 73  Weight: 168 lb (76.2 kg)  Height: 5\' 5"  (1.651 m)     Body mass index is 27.96 kg/m.  Visual Acuity Screening   Right eye Left eye Both eyes  Without  correction: 20/30 20/40   With correction:        Patient is in no distress; well developed, nourished and groomed; neck is supple  CARDIOVASCULAR:  Examination of carotid arteries is normal; no carotid bruits  Regular rate and rhythm, no murmurs  Examination of peripheral vascular system by observation and palpation is normal  EYES:  Ophthalmoscopic exam of optic discs and posterior segments is normal; no papilledema or hemorrhages  MUSCULOSKELETAL:  Gait, strength, tone, movements noted in Neurologic exam below  NEUROLOGIC: MENTAL STATUS:  No flowsheet data found.  awake, alert, oriented to person, place and time  recent and remote memory intact  normal attention and  concentration  language fluent, comprehension intact, naming intact,   fund of knowledge appropriate  CRANIAL NERVE:   2nd - no papilledema on fundoscopic exam  2nd, 3rd, 4th, 6th - pupils equal and reactive to light, visual fields full to confrontation, extraocular muscles intact, no nystagmus  5th - facial sensation symmetric  7th - facial strength symmetric  8th - hearing intact  9th - palate elevates symmetrically, uvula midline  11th - shoulder shrug symmetric  12th - tongue protrusion midline  MOTOR:   normal bulk and tone, full strength in the BUE, BLE  SENSORY:   normal and symmetric to light touch, temperature, vibration  COORDINATION:   finger-nose-finger, fine finger movements normal  REFLEXES:   deep tendon reflexes present and symmetric  GAIT/STATION:   narrow based gait; able to walk tandem; romberg is negative    DIAGNOSTIC DATA (LABS, IMAGING, TESTING) - I reviewed patient records, labs, notes, testing and imaging myself where available.  Lab Results  Component Value Date   WBC 6.9 02/26/2017   HGB 14.9 02/26/2017   HCT 44.7 02/26/2017   MCV 83.1 02/26/2017   PLT 247 02/26/2017      Component Value Date/Time   NA 138 02/26/2017 0839   K 4.0 02/26/2017 0839   CL 106 02/26/2017 0839   CO2 26 02/26/2017 0839   GLUCOSE 99 02/26/2017 0839   BUN 8 02/26/2017 0839   CREATININE 0.91 02/26/2017 0839   CREATININE 1.01 08/28/2014 1524   CALCIUM 8.7 (L) 02/26/2017 0839   PROT 7.3 02/26/2017 0839   ALBUMIN 3.7 02/26/2017 0839   AST 47 (H) 02/26/2017 0839   ALT 87 (H) 02/26/2017 0839   ALKPHOS 84 02/26/2017 0839   BILITOT 0.7 02/26/2017 0839   GFRNONAA >60 02/26/2017 0839   GFRAA >60 02/26/2017 0839   No results found for: CHOL, HDL, LDLCALC, LDLDIRECT, TRIG, CHOLHDL No results found for: ZOXW9UHGBA1C No results found for: VITAMINB12 No results found for: TSH      ASSESSMENT AND PLAN  31 y.o. year old male here with new onset  headache, dizziness, nausea, memory loss since October 2018.  Could represent migraine and related phenomenon versus other secondary cause.  Will proceed with further workup and then consider migraine treatment.   Dx: new onset headache, dizziness, nausea, memory loss  1. Other headache syndrome      PLAN:  HEADACHE / DIZZINESS / NAUSEA (migraine vs other secondary cause) - check MRI brain (to rule out inflamm, stroke, mass) - may consider migraine treatments in future (propranolol + rizatriptan + phenergan)  Orders Placed This Encounter  Procedures  . MR BRAIN W WO CONTRAST   Return in about 3 months (around 10/24/2017).    Suanne MarkerVIKRAM R. Tameron Lama, MD 07/26/2017, 3:19 PM Certified in Neurology, Neurophysiology and Neuroimaging  [facility] [phone]  [address] [place], [place] [address] [phone]

## 2017-08-12 ENCOUNTER — Ambulatory Visit
Admission: RE | Admit: 2017-08-12 | Discharge: 2017-08-12 | Disposition: A | Discharge status: Home / Self Care | Payer: Medicaid Other | Source: Ambulatory Visit | Attending: Diagnostic Neuroimaging | Admitting: Diagnostic Neuroimaging

## 2017-08-12 DIAGNOSIS — G4489 Other headache syndrome: Secondary | ICD-10-CM

## 2017-08-12 MED ORDER — GADOBENATE DIMEGLUMINE 529 MG/ML IV SOLN
15.0000 mL | Freq: Once | INTRAVENOUS | Status: AC | PRN
  Administered 2017-08-12: 15 mL via INTRAVENOUS

## 2017-08-16 ENCOUNTER — Telehealth: Payer: Self-pay | Admitting: *Deleted

## 2017-08-16 NOTE — Telephone Encounter (Signed)
Relayed to pt MRI results.  He is asking for medications for dizziness/ headaches.  I will forward along and see what he recommends.

## 2017-08-16 NOTE — Telephone Encounter (Signed)
-----   Message from Vikram R Penumalli, MD sent at 08/14/2017  5:44 PM EST ----- Unremarkable imaging results. Please call patient. Continue current plan. -VRP 

## 2017-08-16 NOTE — Telephone Encounter (Signed)
-----   Message from Suanne MarkerVikram R Penumalli, MD sent at 08/14/2017  5:44 PM EST ----- Unremarkable imaging results. Please call patient. Continue current plan. -VRP

## 2017-08-17 MED ORDER — PROMETHAZINE HCL 12.5 MG PO TABS: 12.5000 mg | ORAL_TABLET | Freq: Two times a day (BID) | ORAL | 0 refills | Status: DC | PRN

## 2017-08-17 MED ORDER — PROPRANOLOL HCL 20 MG PO TABS: 20.0000 mg | ORAL_TABLET | Freq: Two times a day (BID) | ORAL | 6 refills | Status: DC

## 2017-08-17 MED ORDER — RIZATRIPTAN BENZOATE 10 MG PO TBDP: 10.0000 mg | ORAL_TABLET | ORAL | 11 refills | Status: DC | PRN

## 2017-08-17 NOTE — Telephone Encounter (Signed)
I have just sent in:  MIGRAINE PREVENTION: Propranolol 20mg  twice a day (monitor for low BP or lightheadedness)  MIGRAINE TREATMENT (rizatriptan 10mg  as needed)  NAUSEA TREATMENT (phenergan 12.5mg  as needed)  Meds ordered this encounter  Medications  . propranolol (INDERAL) 20 MG tablet    Sig: Take 1 tablet (20 mg total) by mouth 2 (two) times daily.    Dispense:  60 tablet    Refill:  6  . rizatriptan (MAXALT-MLT) 10 MG disintegrating tablet    Sig: Take 1 tablet (10 mg total) by mouth as needed for migraine. May repeat in 2 hours if needed    Dispense:  9 tablet    Refill:  11  . promethazine (PHENERGAN) 12.5 MG tablet    Sig: Take 1 tablet (12.5 mg total) by mouth 2 (two) times daily as needed for nausea or vomiting.    Dispense:  30 tablet    Refill:  0   Suanne MarkerVIKRAM R. Naithan Delage, MD 08/17/2017, 1:53 PM Certified in Neurology, Neurophysiology and Neuroimaging  [facility] [address], [address] [place], [place] [id number] [phone]

## 2017-08-17 NOTE — Telephone Encounter (Signed)
I spoke to pt and relayed the instructions below on new medications.  I instructed him to not drive if taking phenergan, and to see how maxalt affects him first.  He will check on SE from pharmacist.  Will monitor Bp once starts taking the preventative, propranolol.  Will call back if questions. He verbalized understanding.

## 2017-11-16 ENCOUNTER — Encounter: Payer: Self-pay | Admitting: Gastroenterology

## 2017-12-29 ENCOUNTER — Ambulatory Visit: Payer: Medicaid Other | Admitting: Gastroenterology

## 2018-01-09 ENCOUNTER — Encounter

## 2018-01-09 ENCOUNTER — Encounter: Payer: Self-pay | Admitting: Diagnostic Neuroimaging

## 2018-01-09 ENCOUNTER — Ambulatory Visit: Payer: Medicaid Other | Admitting: Diagnostic Neuroimaging

## 2018-01-09 ENCOUNTER — Telehealth: Payer: Self-pay | Admitting: *Deleted

## 2018-01-09 NOTE — Telephone Encounter (Signed)
Patient no-showed today's appointment.

## 2018-02-26 ENCOUNTER — Encounter: Payer: Self-pay | Admitting: Gastroenterology

## 2018-02-26 ENCOUNTER — Encounter (INDEPENDENT_AMBULATORY_CARE_PROVIDER_SITE_OTHER): Payer: Self-pay

## 2018-02-26 ENCOUNTER — Ambulatory Visit: Payer: Medicaid Other | Admitting: Gastroenterology

## 2018-02-26 VITALS — BP 142/110 | HR 72 | Ht 65.16 in | Wt 164.5 lb

## 2018-02-26 DIAGNOSIS — K529 Noninfective gastroenteritis and colitis, unspecified: Secondary | ICD-10-CM | POA: Diagnosis not present

## 2018-02-26 DIAGNOSIS — K625 Hemorrhage of anus and rectum: Secondary | ICD-10-CM

## 2018-02-26 DIAGNOSIS — R1032 Left lower quadrant pain: Secondary | ICD-10-CM | POA: Diagnosis not present

## 2018-02-26 MED ORDER — PEG-KCL-NACL-NASULF-NA ASC-C 140 G PO SOLR: 140.0000 g | ORAL | 0 refills | Status: DC

## 2018-02-26 MED ORDER — DICYCLOMINE HCL 10 MG PO CAPS: 10.0000 mg | ORAL_CAPSULE | Freq: Two times a day (BID) | ORAL | 2 refills | Status: DC

## 2018-02-26 NOTE — Patient Instructions (Signed)
If you are age 34 or older, your body mass index should be between 23-30. Your Body mass index is 27.24 kg/m. If this is out of the aforementioned range listed, please consider follow up with your Primary Care Provider.  If you are age 34 or younger, your body mass index should be between 19-25. Your Body mass index is 27.24 kg/m. If this is out of the aformentioned range listed, please consider follow up with your Primary Care Provider.   You have been scheduled for a colonoscopy. Please follow written instructions given to you at your visit today.  Please pick up your prep supplies at the pharmacy within the next 1-3 days. If you use inhalers (even only as needed), please bring them with you on the day of your procedure. Your physician has requested that you go to www.startemmi.com and enter the access code given to you at your visit today. This web site gives a general overview about your procedure. However, you should still follow specific instructions given to you by our office regarding your preparation for the procedure.  It was a pleasure to meet you today!  Dr. Myrtie Neitheranis

## 2018-02-26 NOTE — Progress Notes (Signed)
[facility] Consult Note:  History: Derrick Hawkins 02/26/2018  Referring physician: Karie SodaSteven Gross, MD  Reason for consult/chief complaint: Hemorrhoids (x 6 months - with bleeding at times.  Better now.) and Abdominal Pain (generalized abdominal pain x 1 year. )   Subjective  HPI:  This is a pleasant 32 year old man referred by Dr. gross of the surgical practice for evaluation of abdominal pain and diarrhea as well as rectal bleeding.  In office note from Dr. gross in April of this year indicates the patient saw him for rectal bleeding.  Hemorrhoids were discovered on exam, and a single banding was performed  A both left lateral and right posterior columns.  Dr. Luiz BlareGraves did not feel that the band in the left calf she did well, but the right was apparently good.  Patient is a somewhat limited historian.  He reports ongoing pressure of blood per rectum about every 2 to 4 weeks.  More frequently than that, he has at least 2 years of a constellation of generalized abdominal pain and the need for a bowel movement soon after eating, often with urgency.  He has generalized abdominal bloating as well.  He reported that the bleeding is been going on about 6 months, but did not recall having been in the emergency department for rectal bleeding 2 years ago.  He denies painful eye redness, joint swelling or rash. ROS:  Review of Systems  Constitutional: Negative for appetite change and unexpected weight change.  HENT: Negative for mouth sores and voice change.   Eyes: Negative for pain and redness.  Respiratory: Negative for cough and shortness of breath.   Cardiovascular: Negative for chest pain and palpitations.  Genitourinary: Negative for dysuria and hematuria.  Musculoskeletal: Negative for arthralgias and myalgias.  Skin: Negative for pallor and rash.  Neurological: Negative for weakness and headaches.  Hematological: Negative for adenopathy.     Past Medical  History: Past Medical History:  Diagnosis Date  . Dizziness   . Headache   . Internal hemorrhoids      Past Surgical History: Past Surgical History:  Procedure Laterality Date  . NASAL SINUS SURGERY Left y-3     Family History: Family History  Problem Relation Age of Onset  . Hypertension Father   . Asthma Son   . Healthy Mother   . Colon cancer Neg Hx   . Liver cancer Neg Hx   . Stomach cancer Neg Hx   . Rectal cancer Neg Hx   . Esophageal cancer Neg Hx     Social History: Social History   Socioeconomic History  . Marital status: Married    Spouse name: Not on file  . Number of children: 1  . Years of education: Not on file  . Highest education level: Not on file  Occupational History  . Occupation: unemployed  Social Needs  . Financial resource strain: Not on file  . Food insecurity:    Worry: Not on file    Inability: Not on file  . Transportation needs:    Medical: Not on file    Non-medical: Not on file  Tobacco Use  . Smoking status: Former Smoker    Types: Cigarettes    Last attempt to quit: 08/07/2006    Years since quitting: 11.5  . Smokeless tobacco: Never Used  Substance and Sexual Activity  . Alcohol use: Not Currently    Comment: Former heavy ETOH intake. Quit 7 years ago  . Drug use: No  . Sexual  activity: Not on file  Lifestyle  . Physical activity:    Days per week: Not on file    Minutes per session: Not on file  . Stress: Not on file  Relationships  . Social connections:    Talks on phone: Not on file    Gets together: Not on file    Attends religious service: Not on file    Active member of club or organization: Not on file    Attends meetings of clubs or organizations: Not on file    Relationship status: Not on file  Other Topics Concern  . Not on file  Social History Narrative   Lives with wife, son   Unemployed   High school   No caffeine    Allergies: Allergies  Allergen Reactions  . Strawberry Extract Nausea  And Vomiting         Outpatient Meds: Current Outpatient Medications  Medication Sig Dispense Refill  . dicyclomine (BENTYL) 10 MG capsule Take 1 capsule (10 mg total) by mouth 2 (two) times daily with a meal. 60 capsule 2  . PEG-KCl-NaCl-NaSulf-Na Asc-C (PLENVU) 140 g SOLR Take 140 g by mouth as directed. 1 each 0   No current facility-administered medications for this visit.       ___________________________________________________________________ Objective   Exam:  BP (!) 142/110   Pulse 72   Ht 5' 5.16" (1.655 m)   Wt 164 lb 8 oz (74.6 kg)   BMI 27.24 kg/m    General: this is a(n) well-appearing man  Eyes: sclera anicteric, no redness  ENT: oral mucosa moist without lesions, no cervical or supraclavicular lymphadenopathy, good dentition  CV: RRR without murmur, S1/S2, no JVD, no peripheral edema  Resp: clear to auscultation bilaterally, normal RR and effort noted  GI: soft, no tenderness, with active bowel sounds. No guarding or palpable organomegaly noted.  Skin; warm and dry, no rash or jaundice noted  Neuro: awake, alert and oriented x 3. Normal gross motor function and fluent speech  Labs:  CBC Latest Ref Rng & Units 02/26/2017 02/03/2016 09/08/2014  WBC 4.0 - 10.5 K/uL 6.9 9.8 7.8  Hemoglobin 13.0 - 17.0 g/dL 16.1 09.6 04.5  Hematocrit 39.0 - 52.0 % 44.7 43.6 45.5  Platelets 150 - 400 K/uL 247 295 241     Assessment: Encounter Diagnoses  Name Primary?  Marland Kitchen LLQ abdominal pain Yes  . Chronic diarrhea   . Rectal bleeding     It seems most like IBS with hemorrhoidal bleeding, but IBD should be ruled out definitively, especially given her ongoing symptoms.  I advised him to have a colonoscopy, and he is agreeable after a thorough discussion of procedure and risks.  The benefits and risks of the planned procedure were described in detail with the patient or (when appropriate) their health care proxy.  Risks were outlined as including, but not limited to,  bleeding, infection, perforation, adverse medication reaction leading to cardiac or pulmonary decompensation, or pancreatitis (if ERCP).  The limitation of incomplete mucosal visualization was also discussed.  No guarantees or warranties were given.  If he has significant hemorrhoids, they may need repeat attempted banding at a later date.  Meanwhile, I have given him a trial of dicyclomine 10 mg twice daily.  Thank you for the courtesy of this consult.  Please call me with any questions or concerns.  Charlie Pitter III  CC: Karie Soda, MD

## 2018-03-07 ENCOUNTER — Ambulatory Visit (AMBULATORY_SURGERY_CENTER): Payer: Medicaid Other | Admitting: Gastroenterology

## 2018-03-07 ENCOUNTER — Encounter: Payer: Self-pay | Admitting: Gastroenterology

## 2018-03-07 VITALS — BP 113/75 | HR 59 | Temp 98.6°F | Resp 15 | Ht 65.0 in | Wt 164.0 lb

## 2018-03-07 DIAGNOSIS — R1032 Left lower quadrant pain: Secondary | ICD-10-CM

## 2018-03-07 DIAGNOSIS — K625 Hemorrhage of anus and rectum: Secondary | ICD-10-CM

## 2018-03-07 DIAGNOSIS — K529 Noninfective gastroenteritis and colitis, unspecified: Secondary | ICD-10-CM | POA: Diagnosis not present

## 2018-03-07 MED ORDER — SODIUM CHLORIDE 0.9 % IV SOLN: 500.0000 mL | Freq: Once | INTRAVENOUS | Status: DC

## 2018-03-07 NOTE — Progress Notes (Signed)
Called to room to assist during endoscopic procedure.  Patient ID and intended procedure confirmed with present staff. Received instructions for my participation in the procedure from the performing physician.  

## 2018-03-07 NOTE — Op Note (Signed)
[facility] Patient Name: Derrick Hawkins Procedure Date: 03/07/2018 11:00 AM MRN: [id number] Endoscopist: Sherilyn CooterHenry L. Myrtie Neitheranis , MD Age: [age] Referring MD:  Date of Birth: 03/03/1986 Gender: Male Account #: [id number] Procedure:                Colonoscopy Indications:              Abdominal pain in the left lower quadrant, Rectal                            bleeding, Chronic diarrhea; patient had a single                            session of hemorrhoidal banding by referring                            physician. Medicines:                Monitored Anesthesia Care Procedure:                Pre-Anesthesia Assessment:                           - Prior to the procedure, a History and Physical                            was performed, and patient medications and                            allergies were reviewed. The patient's tolerance of                            previous anesthesia was also reviewed. The risks                            and benefits of the procedure and the sedation                            options and risks were discussed with the patient.                            All questions were answered, and informed consent                            was obtained. Prior Anticoagulants: The patient has                            taken no previous anticoagulant or antiplatelet                            agents. ASA Grade Assessment: II - A patient with                            mild systemic disease. After reviewing the risks  and benefits, the patient was deemed in                            satisfactory condition to undergo the procedure.                           After obtaining informed consent, the colonoscope                            was passed under direct vision. Throughout the                            procedure, the patient's blood pressure, pulse, and                            oxygen saturations were monitored continuously. The                       Colonoscope was introduced through the anus and                            advanced to the the terminal ileum, with                            identification of the appendiceal orifice and IC                            valve. The colonoscopy was performed without                            difficulty. The patient tolerated the procedure                            well. The quality of the bowel preparation was                            excellent. The terminal ileum, ileocecal valve,                            appendiceal orifice, and rectum were photographed.                            The quality of the bowel preparation was evaluated                            using the BBPS [facility] Bowel Preparation Scale)                            with scores of: Right Colon = 3, Transverse Colon =                            3 and Left Colon = 3 (entire mucosa seen well with  no residual staining, small fragments of stool or                            opaque liquid). The total BBPS score equals 9. Scope In: 11:03:33 AM Scope Out: 11:10:37 AM Scope Withdrawal Time: 0 hours 5 minutes 56 seconds  Total Procedure Duration: 0 hours 7 minutes 4 seconds  Findings:                 The perianal and digital rectal examinations were                            normal.                           The terminal ileum appeared normal.                           Normal mucosa was found in the entire colon.                            Biopsies for histology were taken with a cold                            forceps from the left colon for evaluation of                            microscopic colitis.                           The retroflexed view of the distal rectum and anal                            verge was normal and showed no anal or rectal                            abnormalities. Complications:            No immediate complications. Estimated Blood Loss:     Estimated  blood loss was minimal. Impression:               - The examined portion of the ileum was normal.                           - Normal mucosa in the entire examined colon.                            Biopsied.                           - The distal rectum and anal verge are normal on                            retroflexion view. No hemorrhoids seen. Benign anal                            bleeding. Recommendation:           -  Patient has a contact number available for                            emergencies. The signs and symptoms of potential                            delayed complications were discussed with the                            patient. Return to normal activities tomorrow.                            Written discharge instructions were provided to the                            patient.                           - Resume previous diet.                           - Continue present medications, including                            dicyclomine for suspected IBS.                           - Await pathology results.                           - No current plans for repeat colonoscopy planned                            due to young age. Eventual screening colonoscopy                            recommended. Sevan Mcbroom L. Myrtie Neither, MD 03/07/2018 11:17:26 AM This report has been signed electronically.

## 2018-03-07 NOTE — Progress Notes (Signed)
Pt's states no medical or surgical changes since previsit or office visit. 

## 2018-03-07 NOTE — Progress Notes (Signed)
A/ox3, pleased with MAC, report to RN 

## 2018-03-07 NOTE — Patient Instructions (Signed)
Random biopsies taken to rule out microscopic colitits.  Dr Myrtie Neitheranis will notify you with the results within 3 weeks.  Often he mails you a letter.  YOU HAD AN ENDOSCOPIC PROCEDURE TODAY AT THE [facility]:   Refer to the procedure report that was given to you for any specific questions about what was found during the examination.  If the procedure report does not answer your questions, please call your gastroenterologist to clarify.  If you requested that your care partner not be given the details of your procedure findings, then the procedure report has been included in a sealed envelope for you to review at your convenience later.  YOU SHOULD EXPECT: Some feelings of bloating in the abdomen. Passage of more gas than usual.  Walking can help get rid of the air that was put into your GI tract during the procedure and reduce the bloating. If you had a lower endoscopy (such as a colonoscopy or flexible sigmoidoscopy) you may notice spotting of blood in your stool or on the toilet paper. If you underwent a bowel prep for your procedure, you may not have a normal bowel movement for a few days.  Please Note:  You might notice some irritation and congestion in your nose or some drainage.  This is from the oxygen used during your procedure.  There is no need for concern and it should clear up in a day or so.  SYMPTOMS TO REPORT IMMEDIATELY:   Following lower endoscopy (colonoscopy or flexible sigmoidoscopy):  Excessive amounts of blood in the stool  Significant tenderness or worsening of abdominal pains  Swelling of the abdomen that is new, acute  Fever of 100F or higher   For urgent or emergent issues, a gastroenterologist can be reached at any hour by calling [phone].   DIET:  We do recommend a small meal at first, but then you may proceed to your regular diet.  Drink plenty of fluids but you should avoid alcoholic beverages for 24 hours.  ACTIVITY:  You should plan to take it  easy for the rest of today and you should NOT DRIVE or use heavy machinery until tomorrow (because of the sedation medicines used during the test).    FOLLOW UP: Our staff will call the number listed on your records the next business day following your procedure to check on you and address any questions or concerns that you may have regarding the information given to you following your procedure. If we do not reach you, we will leave a message.  However, if you are feeling well and you are not experiencing any problems, there is no need to return our call.  We will assume that you have returned to your regular daily activities without incident.  If any biopsies were taken you will be contacted by phone or by letter within the next 1-3 weeks.  Please call us at [phone] if you have not heard about the biopsies in 3 weeks.    SIGNATURES/CONFIDENTIALITY: You and/or your care partner have signed paperwork which will be entered into your electronic medical record.  These signatures attest to the fact that that the information above on your After Visit Summary has been reviewed and is understood.  Full responsibility of the confidentiality of this discharge information lies with you and/or your care-partner.

## 2018-03-08 ENCOUNTER — Telehealth: Payer: Self-pay

## 2018-03-08 NOTE — Telephone Encounter (Signed)
  Follow up Call-  Call back number 03/07/2018  Post procedure Call Back phone  # [phone]  Permission to leave phone message Yes  Some recent data might be hidden     Patient questions:  Do you have a fever, pain , or abdominal swelling? No. Pain Score  0 *  Have you tolerated food without any problems? Yes.    Have you been able to return to your normal activities? Yes.    Do you have any questions about your discharge instructions: Diet   No. Medications  No. Follow up visit  No.  Do you have questions or concerns about your Care? No.  Actions: * If pain score is 4 or above: No action needed, pain <4.

## 2018-03-20 ENCOUNTER — Other Ambulatory Visit: Payer: Self-pay

## 2018-03-20 MED ORDER — DICYCLOMINE HCL 10 MG PO CAPS: 10.0000 mg | ORAL_CAPSULE | Freq: Two times a day (BID) | ORAL | 2 refills | Status: DC

## 2018-05-11 ENCOUNTER — Ambulatory Visit: Payer: Medicaid Other | Admitting: Physician Assistant

## 2018-05-23 ENCOUNTER — Ambulatory Visit (INDEPENDENT_AMBULATORY_CARE_PROVIDER_SITE_OTHER): Payer: Medicaid Other | Admitting: Physician Assistant

## 2018-05-23 ENCOUNTER — Other Ambulatory Visit (INDEPENDENT_AMBULATORY_CARE_PROVIDER_SITE_OTHER): Payer: Medicaid Other

## 2018-05-23 ENCOUNTER — Encounter: Payer: Self-pay | Admitting: Physician Assistant

## 2018-05-23 VITALS — BP 124/70 | HR 72 | Ht 65.0 in | Wt 166.0 lb

## 2018-05-23 DIAGNOSIS — R945 Abnormal results of liver function studies: Secondary | ICD-10-CM

## 2018-05-23 DIAGNOSIS — R1013 Epigastric pain: Secondary | ICD-10-CM

## 2018-05-23 DIAGNOSIS — K219 Gastro-esophageal reflux disease without esophagitis: Secondary | ICD-10-CM | POA: Diagnosis not present

## 2018-05-23 DIAGNOSIS — R7989 Other specified abnormal findings of blood chemistry: Secondary | ICD-10-CM

## 2018-05-23 LAB — COMPREHENSIVE METABOLIC PANEL
ALT: 34 U/L (ref 0–53)
AST: 23 U/L (ref 0–37)
Albumin: 4.4 g/dL (ref 3.5–5.2)
Alkaline Phosphatase: 65 U/L (ref 39–117)
BUN: 15 mg/dL (ref 6–23)
CHLORIDE: 104 meq/L (ref 96–112)
CO2: 27 meq/L (ref 19–32)
Calcium: 9.1 mg/dL (ref 8.4–10.5)
Creatinine, Ser: 0.95 mg/dL (ref 0.40–1.50)
GFR: 96.49 mL/min (ref >60.00)
GLUCOSE: 93 mg/dL (ref 70–99)
POTASSIUM: 3.6 meq/L (ref 3.5–5.1)
Sodium: 139 mEq/L (ref 135–145)
Total Bilirubin: 0.8 mg/dL (ref 0.2–1.2)
Total Protein: 7.9 g/dL (ref 6.0–8.3)

## 2018-05-23 LAB — CBC WITH DIFFERENTIAL/PLATELET
BASOS PCT: 0.7 % (ref 0.0–3.0)
Basophils Absolute: 0 10*3/uL (ref 0.0–0.1)
EOS PCT: 3.8 % (ref 0.0–5.0)
Eosinophils Absolute: 0.3 10*3/uL (ref 0.0–0.7)
HCT: 46.8 % (ref 39.0–52.0)
Hemoglobin: 15.4 g/dL (ref 13.0–17.0)
LYMPHS ABS: 2 10*3/uL (ref 0.7–4.0)
Lymphocytes Relative: 29 % (ref 12.0–46.0)
MCHC: 32.8 g/dL (ref 30.0–36.0)
MCV: 85 fl (ref 78.0–100.0)
Monocytes Absolute: 0.4 10*3/uL (ref 0.1–1.0)
Monocytes Relative: 6.1 % (ref 3.0–12.0)
NEUTROS ABS: 4.2 10*3/uL (ref 1.4–7.7)
NEUTROS PCT: 60.4 % (ref 43.0–77.0)
PLATELETS: 275 10*3/uL (ref 150.0–400.0)
RBC: 5.51 Mil/uL (ref 4.22–5.81)
RDW: 14 % (ref 11.5–15.5)
WBC: 7 10*3/uL (ref 4.0–10.5)

## 2018-05-23 MED ORDER — OMEPRAZOLE 40 MG PO CPDR: DELAYED_RELEASE_CAPSULE | ORAL | 11 refills | Status: AC

## 2018-05-23 NOTE — Patient Instructions (Addendum)
Your provider has requested that you go to the basement level for lab work before leaving today. Press "B" on the elevator. The lab is located at the first door on the left as you exit the elevator.  We sent a prescription to [address]  1. Omeprazole 40 mg  Normal BMI (Body Mass Index- based on height and weight) is between 19 and 25. Your BMI today is Body mass index is 27.62 kg/m. Marland Kitchen Please consider follow up  regarding your BMI with your Primary Care Provider.

## 2018-05-23 NOTE — Progress Notes (Signed)
Subjective:    Patient ID: Derrick Hawkins, male    DOB: [birth year], 32 y.o.   MRN: [id number]  HPI Derrick Hawkins is a pleasant 32 year old [place] male known to Dr. Loletha Carrow.  He has been living in the [place] over the past 10 years. He was last seen in the office in July 2019 with complaints of loose stools and left-sided abdominal pain.  He apparently also has prior history of hemorrhoids and had undergone a hemorrhoidal banding per Dr. Johney Maine previously. He had been given a trial of Bentyl and Citrucel, stopped the Bentyl as it was not effective. He underwent colonoscopy on 03/07/2018 which was a normal exam with no evidence of internal hemorrhoids.  He had random biopsies done which were benign with no evidence of microscopic colitis. Comes in today with complaints of epigastric pain which he says he has had off and on over the past 3 years.  He says he will have pain a couple of times a year that may last for 3 or 4 days and describes it as a squeezing pressure, crampy type pain.  He is able to eat during these episodes does not have any nausea or vomiting very He says usually it will bother him worse in the morning.  He says when he gets up he feels as if there is something "blocked" in his esophagus or stomach which eventually opens up and then he feels better.  He denies any dysphagia or odynophagia.  He does feel that he has reflux type symptoms with fluid coming back up into his chest and mouth at times. No regular NSAID use  Review of Systems Pertinent positive and negative review of systems were noted in the above HPI section.  All other review of systems was otherwise negative.  Outpatient Encounter Medications as of 05/23/2018  Medication Sig  . omeprazole (PRILOSEC) 40 MG capsule Take 1 tablet by mouth every morning.  . [DISCONTINUED] dicyclomine (BENTYL) 10 MG capsule Take 1 capsule (10 mg total) by mouth 2 (two) times daily with a meal.   No facility-administered encounter medications on file  as of 05/23/2018.    Allergies  Allergen Reactions  . Strawberry Extract Nausea And Vomiting        There are no active problems to display for this patient.  Social History   Socioeconomic History  . Marital status: Married    Spouse name: Not on file  . Number of children: 1  . Years of education: Not on file  . Highest education level: Not on file  Occupational History  . Occupation: unemployed  Social Needs  . Financial resource strain: Not on file  . Food insecurity:    Worry: Not on file    Inability: Not on file  . Transportation needs:    Medical: Not on file    Non-medical: Not on file  Tobacco Use  . Smoking status: Former Smoker    Types: Cigarettes    Last attempt to quit: 08/07/2006    Years since quitting: 11.8  . Smokeless tobacco: Never Used  Substance and Sexual Activity  . Alcohol use: Not Currently    Comment: Former heavy ETOH intake. Quit 7 years ago  . Drug use: No  . Sexual activity: Not on file  Lifestyle  . Physical activity:    Days per week: Not on file    Minutes per session: Not on file  . Stress: Not on file  Relationships  . Social connections:    Talks  on phone: Not on file    Gets together: Not on file    Attends religious service: Not on file    Active member of club or organization: Not on file    Attends meetings of clubs or organizations: Not on file    Relationship status: Not on file  . Intimate partner violence:    Fear of current or ex partner: Not on file    Emotionally abused: Not on file    Physically abused: Not on file    Forced sexual activity: Not on file  Other Topics Concern  . Not on file  Social History Narrative   Lives with wife, son   Unemployed   High school   No caffeine    Mr. Ouderkirk's family history includes Asthma in his son; Healthy in his mother; Hypertension in his father.      Objective:    Vitals:   05/23/18 1025  BP: 124/70  Pulse: 72    Physical Exam; well-developed young  [place] male in no acute distress, alone today, pleasant, he speaks [place] but there is some language barrier.  Blood pressure 124/70 pulse 72, BMI 27.6.  HEENT; nontraumatic normocephalic EOMI PERRLA sclera anicteric oral mucosa moist, Cardiovascular; regular rate and rhythm with S1-S2 no murmur rub or gallop, Pulmonary ;clear bilaterally, Abdomen; he has some mild epigastric/subxiphoid tenderness no guarding or rebound no palpable mass or hepatosplenomegaly bowel sounds are present Rectal ;exam not done, Extremities; no clubbing cyanosis or edema skin warm dry, Neuro psych alert and oriented, grossly nonfocal mood and affect appropriate       Assessment & Plan:   [age] year old [place] male with intermittent epigastric pain, episodic over the past 3 years with symptoms occurring a couple of times per year and lasting for 3 to 4 days.  He has had more persistent symptoms over the past few weeks. By description I suspect he has GERD and may have some esophagitis, rule out gastritis or peptic ulcer disease Upper abdominal ultrasound done 2016 was negative  #2 chronic mild transaminitis-etiology not clear venous ultrasound did not show any evidence of fatty liver disease #3 IBS-negative colonoscopy including random biopsies July 2019  Plan; We will check CBC, C met, chronic hepatitis serologies and H. pylori antibody. Start trial of omeprazole 40 mg p.o. every morning, prescription sent with several refills. He is advised to avoid alcohol and heavier spicy foods Will determine need for interval follow-up pending results of above labs.  Amy S Esterwood PA-C 05/23/2018   Cc: Dorna Mai, MD

## 2018-05-23 NOTE — Progress Notes (Signed)
Thank you for sending this case to me. I have reviewed the entire note, and the outlined plan seems appropriate.  If above workup negative, I suspect he may have a functional bowel disorder.  Amada Jupiter, MD

## 2018-05-25 ENCOUNTER — Other Ambulatory Visit: Payer: Self-pay

## 2018-05-25 LAB — H PYLORI, IGM, IGG, IGA AB
H pylori, IgM Abs: <9 units (ref 0.0–8.9)
H. pylori, IgA Abs: >35 units — ABNORMAL HIGH (ref 0.0–8.9)
H. pylori, IgG AbS: 3.96 Index Value — ABNORMAL HIGH (ref 0.00–0.79)

## 2018-05-25 MED ORDER — BIS SUBCIT-METRONID-TETRACYC 140-125-125 MG PO CAPS: 3.0000 | ORAL_CAPSULE | Freq: Three times a day (TID) | ORAL | 0 refills | Status: AC

## 2018-05-29 LAB — HCV RNA,QUANTITATIVE REAL TIME PCR
HCV Quantitative Log: <1.18 Log IU/mL
HCV RNA, PCR, QN: <15 IU/mL

## 2018-05-29 LAB — HEPATITIS C ANTIBODY
HEP C AB: REACTIVE — AB
SIGNAL TO CUT-OFF: 1.9 — ABNORMAL HIGH (ref <1.00)

## 2018-05-29 LAB — HEPATITIS B SURFACE ANTIGEN: HEP B S AG: NONREACTIVE

## 2018-05-29 LAB — HEPATITIS B SURFACE ANTIBODY,QUALITATIVE: Hep B S Ab: REACTIVE — AB

## 2018-05-29 LAB — HEPATITIS A ANTIBODY, TOTAL: Hepatitis A AB,Total: REACTIVE — AB

## 2022-10-26 ENCOUNTER — Emergency Department (HOSPITAL_COMMUNITY)
Admission: EM | Admit: 2022-10-26 | Discharge: 2022-10-27 | Disposition: A | Discharge status: Home / Self Care | Payer: Medicaid Other | Attending: Emergency Medicine | Admitting: Emergency Medicine

## 2022-10-26 DIAGNOSIS — G8918 Other acute postprocedural pain: Secondary | ICD-10-CM

## 2022-10-26 DIAGNOSIS — L7622 Postprocedural hemorrhage and hematoma of skin and subcutaneous tissue following other procedure: Secondary | ICD-10-CM | POA: Diagnosis not present

## 2022-10-26 DIAGNOSIS — K068 Other specified disorders of gingiva and edentulous alveolar ridge: Secondary | ICD-10-CM

## 2022-10-26 LAB — CBC
HCT: 46.7 % (ref 39.0–52.0)
Hemoglobin: 15.8 g/dL (ref 13.0–17.0)
MCH: 28.7 pg (ref 26.0–34.0)
MCHC: 33.8 g/dL (ref 30.0–36.0)
MCV: 84.8 fL (ref 80.0–100.0)
Platelets: 347 10*3/uL (ref 150–400)
RBC: 5.51 MIL/uL (ref 4.22–5.81)
RDW: 13.2 % (ref 11.5–15.5)
WBC: 14.9 10*3/uL — ABNORMAL HIGH (ref 4.0–10.5)
nRBC: 0 % (ref 0.0–0.2)

## 2022-10-26 LAB — BASIC METABOLIC PANEL
Anion gap: 9 (ref 5–15)
BUN: 20 mg/dL (ref 6–20)
CO2: 24 mmol/L (ref 22–32)
Calcium: 8.8 mg/dL — ABNORMAL LOW (ref 8.9–10.3)
Chloride: 103 mmol/L (ref 98–111)
Creatinine, Ser: 0.96 mg/dL (ref 0.61–1.24)
GFR, Estimated: >60 mL/min (ref >60)
Glucose, Bld: 136 mg/dL — ABNORMAL HIGH (ref 70–99)
Potassium: 4.1 mmol/L (ref 3.5–5.1)
Sodium: 136 mmol/L (ref 135–145)

## 2022-10-26 LAB — HEMOGLOBIN AND HEMATOCRIT, BLOOD
HCT: 45.5 % (ref 39.0–52.0)
Hemoglobin: 14.7 g/dL (ref 13.0–17.0)

## 2022-10-26 MED ORDER — TRANEXAMIC ACID FOR EPISTAXIS
500.0000 mg | Freq: Once | TOPICAL | Status: AC
  Administered 2022-10-26: 500 mg via TOPICAL
  Filled 2022-10-26: qty 10

## 2022-10-26 MED ORDER — LACTATED RINGERS IV BOLUS
1000.0000 mL | Freq: Once | INTRAVENOUS | Status: AC
  Administered 2022-10-26: 1000 mL via INTRAVENOUS

## 2022-10-26 MED ORDER — SILVER NITRATE-POT NITRATE 75-25 % EX MISC
1.0000 | Freq: Once | CUTANEOUS | Status: DC
  Filled 2022-10-26: qty 1

## 2022-10-26 MED ORDER — FENTANYL CITRATE PF 50 MCG/ML IJ SOSY
50.0000 ug | PREFILLED_SYRINGE | Freq: Once | INTRAMUSCULAR | Status: AC
  Administered 2022-10-26: 50 ug via INTRAVENOUS
  Filled 2022-10-26: qty 1

## 2022-10-26 NOTE — ED Notes (Signed)
Provider at bedside with pt at this time.

## 2022-10-26 NOTE — ED Provider Notes (Signed)
  Physical Exam  BP (!) 164/113   Pulse 97   Temp 99.2 F (37.3 C)   Resp 16   SpO2 98%   Physical Exam  Procedures  Procedures  ED Course / MDM   Clinical Course as of 10/26/22 1901  Wed Oct 26, 2022  1900 Assumed care from Domenic Moras PA. Have tried. Gauze soaked with TXA and tea bags. Awaiting labs and reassessment to see if hemostasis has been achieved.  [RP]    Clinical Course User Index [RP] Fransico Meadow, MD   Medical Decision Making  ***

## 2022-10-26 NOTE — ED Triage Notes (Signed)
Patient here with complaint of bleeding from his mouth five hours after a root canal earlier today. Patient is alert, oriented, ambulating independently with steady gait. Patient denies anticoagulation.

## 2022-10-26 NOTE — ED Provider Notes (Signed)
[facility] Provider Note   CSN: [id number] Arrival date & time: 10/26/22  1708     History  Chief Complaint  Patient presents with   Post-op Problem    Derrick Hawkins is a 37 y.o. male.  The history is provided by the patient, the spouse and medical records. No language interpreter was used.     37 year old male presenting with complaints of postoperative bleeding.  Patient had a root canal earlier today and states he has been having bleeding coming from the site since.  Bleeding is copious, he feels lightheadedness from all the bleeding.  He does endorse some pain and facial swelling from his root canal.  Family did try to reach out to the dentist office and eventually was recommended to come to the ER for further care.  No other complaint.  He is not on any blood thinner medication.  Home Medications Prior to Admission medications   Medication Sig Start Date End Date Taking? Authorizing Provider  bismuth-metronidazole-tetracycline [facility]) [phone] MG capsule Take 3 capsules by mouth 4 (four) times daily -  before meals and at bedtime. 05/25/18   Esterwood, Amy S, PA-C  omeprazole (PRILOSEC) 40 MG capsule Take 1 tablet by mouth every morning. 05/23/18   Esterwood, Amy S, PA-C      Allergies    Strawberry extract    Review of Systems   Review of Systems  All other systems reviewed and are negative.   Physical Exam Updated Vital Signs BP (!) 159/112   Pulse (!) 107   Temp 99.2 F (37.3 C)   Resp 16   SpO2 99%  Physical Exam Vitals and nursing note reviewed.  Constitutional:      General: He is not in acute distress.    Appearance: He is well-developed.  HENT:     Head: Atraumatic.     Mouth/Throat:     Comments: Copious amount of blood noted in mouth, blood soaked gauze noted to right side of gum which I removed.  Active bleeding noted coming from right lower gum at the previous extracted molar.  Adjacent facial  swelling noted. Eyes:     Conjunctiva/sclera: Conjunctivae normal.  Cardiovascular:     Rate and Rhythm: Normal rate and regular rhythm.     Pulses: Normal pulses.     Heart sounds: Normal heart sounds.  Pulmonary:     Effort: Pulmonary effort is normal.     Breath sounds: Normal breath sounds. No wheezing, rhonchi or rales.  Musculoskeletal:     Cervical back: Neck supple.  Skin:    Findings: No rash.  Neurological:     Mental Status: He is alert.     ED Results / Procedures / Treatments   Labs (all labs ordered are listed, but only abnormal results are displayed) Labs Reviewed  CBC  BASIC METABOLIC PANEL    EKG None  Radiology No results found.  Procedures Procedures    Medications Ordered in ED Medications - No data to display  ED Course/ Medical Decision Making/ A&P                             Medical Decision Making  BP (!) 159/112   Pulse (!) 107   Temp 99.2 F (37.3 C)   Resp 16   SpO2 99%   41:29 PM 37 year old male presenting with complaints of postoperative bleeding.  Patient had a root canal  earlier today and states he has been having bleeding coming from the site since.  Bleeding is copious, he feels lightheadedness from all the bleeding.  He does endorse some pain and facial swelling from his root canal.  Family did try to reach out to the dentist office and eventually was recommended to come to the ER for further care.  No other complaint.  He is not on any blood thinner medication.  On exam, patient appears uncomfortable.  He does have some associated facial swelling noted to his right cheek.  When he open his mouth, copious amount of blood were noted inside the oral cavity.  A blood soaked gauze were noted as well.  I remove the gauze and noted active bleeding coming from his right lower gum at the site of his previous extracted lower molar.  No evidence of airway compromise.  Vital signs notable for elevated blood pressure of 159/112,  tachycardia with heart rate of 107.  6:37 PM I used a Yankauer to evacuate the blood in pt's mouth.  I initially apply two teabags to the affected site.  I returned approximately 30 minutes later but bleeding persist.  I then apply a 4x4 gauze soaked with 500mg  of TXA and apply to affected area.  Pt will be monitor closely.    Pt sign out to Dr. Philip Aspen.          Final Clinical Impression(s) / ED Diagnoses Final diagnoses:  None    Rx / DC Orders ED Discharge Orders     None         Domenic Moras, PA-C 10/26/22 1856    Fransico Meadow, MD 10/28/22 [phone]

## 2022-10-26 NOTE — ED Provider Triage Note (Signed)
Emergency Medicine Provider Triage Evaluation Note  Derrick Hawkins , a 37 y.o. male  was evaluated in triage.  Pt complains of bleeding from his mouth after a root canal that was done earlier today.  He has not been able to get the bleeding to slow and he is spitting out very large clots.  He states the right side of his face is very swollen now too.  Denies taking blood thinners.   Review of Systems  Positive: As above Negative: As above  Physical Exam  BP (!) 159/112   Pulse (!) 107   Temp 99.2 F (37.3 C)   Resp 16   SpO2 99%  Gen:   Awake, no distress   Resp:  Normal effort  MSK:   Moves extremities without difficulty  Other:  Large clots coming from right side of mouth, has active bright red bleeding, with moderate amount of blood   Medical Decision Making  Medically screening exam initiated at 5:25 PM.  Appropriate orders placed.  Parth Perdomo was informed that the remainder of the evaluation will be completed by another provider, this initial triage assessment does not replace that evaluation, and the importance of remaining in the ED until their evaluation is complete.  Had patient bite down on gauze in attempt to slow bleeding    Pat Kocher, [place] 10/26/22 1735

## 2022-10-26 NOTE — Discharge Instructions (Addendum)
You were seen for your mouth bleeding after your wisdom tooth extraction in the emergency department.   At home, please pack your mouth and keep pressure on your tooth if you experience bleeding again.    Please try to sleep sitting up tonight.  Please eat a liquid diet and avoid straws for the next several days.  Follow-up with your dentist tomorrow regarding your visit.    Return immediately to the emergency department if you experience any of the following: Bleeding that lasts for more than 20 minutes, fainting, chest pain, shortness of breath, or any other concerning symptoms.    Thank you for visiting our Emergency Department. It was a pleasure taking care of you today.

## 2022-10-27 NOTE — ED Notes (Addendum)
Pt provided with AVS.  Education complete; all questions answered.    Pt provided with gauze packs per request in case bleeding starts back at home.  Pt leaving ED in stable condition at this time, ambulatory with all belongings.
